# Patient Record
Sex: Male | Born: 1960
Health system: Southern US, Community
[De-identification: ages and names within clinical notes are randomized; demographics above are authoritative.]

## PROBLEM LIST (undated history)

## (undated) DIAGNOSIS — I4892 Unspecified atrial flutter: Secondary | ICD-10-CM

## (undated) HISTORY — PX: HERNIA REPAIR: SHX51

## (undated) HISTORY — PX: OTHER SURGICAL HISTORY: SHX169

## (undated) HISTORY — PX: BACK SURGERY: SHX140

## (undated) HISTORY — PX: ELECTROPHYSIOLOGIC STUDY: SHX172A

## (undated) HISTORY — DX: Unspecified atrial flutter: I48.92

---

## 1997-03-22 HISTORY — PX: OTHER SURGICAL HISTORY: SHX169

## 1998-03-18 ENCOUNTER — Encounter: Payer: Self-pay | Admitting: Neurosurgery

## 1998-03-18 ENCOUNTER — Observation Stay (HOSPITAL_COMMUNITY): Admission: RE | Admit: 1998-03-18 | Discharge: 1998-03-19 | Payer: Self-pay | Admitting: Neurosurgery

## 1999-11-06 ENCOUNTER — Encounter: Payer: Self-pay | Admitting: Gastroenterology

## 2007-02-01 ENCOUNTER — Ambulatory Visit: Payer: Self-pay | Admitting: Cardiology

## 2007-02-07 ENCOUNTER — Ambulatory Visit: Payer: Self-pay

## 2007-02-09 ENCOUNTER — Ambulatory Visit: Payer: Self-pay | Admitting: Internal Medicine

## 2007-02-13 ENCOUNTER — Ambulatory Visit: Payer: Self-pay

## 2007-02-20 ENCOUNTER — Ambulatory Visit: Payer: Self-pay

## 2007-03-03 ENCOUNTER — Ambulatory Visit: Payer: Self-pay | Admitting: Internal Medicine

## 2007-03-07 ENCOUNTER — Ambulatory Visit (HOSPITAL_COMMUNITY): Admission: RE | Admit: 2007-03-07 | Discharge: 2007-03-08 | Payer: Self-pay | Admitting: Internal Medicine

## 2007-03-07 ENCOUNTER — Ambulatory Visit: Payer: Self-pay | Admitting: Internal Medicine

## 2007-03-13 ENCOUNTER — Ambulatory Visit: Payer: Self-pay | Admitting: Cardiology

## 2007-03-28 ENCOUNTER — Ambulatory Visit: Payer: Self-pay | Admitting: Internal Medicine

## 2008-07-04 ENCOUNTER — Emergency Department (HOSPITAL_COMMUNITY): Admission: EM | Admit: 2008-07-04 | Discharge: 2008-07-04 | Payer: Self-pay | Admitting: Family Medicine

## 2008-10-10 ENCOUNTER — Emergency Department (HOSPITAL_COMMUNITY): Admission: EM | Admit: 2008-10-10 | Discharge: 2008-10-10 | Payer: Self-pay | Admitting: Family Medicine

## 2009-01-28 ENCOUNTER — Ambulatory Visit: Payer: Self-pay | Admitting: Gastroenterology

## 2009-01-28 DIAGNOSIS — K625 Hemorrhage of anus and rectum: Secondary | ICD-10-CM

## 2009-01-28 LAB — CONVERTED CEMR LAB
Basophils Absolute: 0 10*3/uL (ref 0.0–0.1)
Basophils Relative: 0.6 % (ref 0.0–3.0)
Eosinophils Absolute: 0.1 10*3/uL (ref 0.0–0.7)
Eosinophils Relative: 2.3 % (ref 0.0–5.0)
HCT: 42.7 % (ref 39.0–52.0)
Hemoglobin: 14.1 g/dL (ref 13.0–17.0)
Lymphocytes Relative: 30.5 % (ref 12.0–46.0)
Lymphs Abs: 1.8 10*3/uL (ref 0.7–4.0)
MCHC: 33.2 g/dL (ref 30.0–36.0)
MCV: 99 fL (ref 78.0–100.0)
Monocytes Absolute: 0.5 10*3/uL (ref 0.1–1.0)
Monocytes Relative: 8.9 % (ref 3.0–12.0)
Neutro Abs: 3.5 10*3/uL (ref 1.4–7.7)
Neutrophils Relative %: 57.7 % (ref 43.0–77.0)
Platelets: 200 10*3/uL (ref 150.0–400.0)
RBC: 4.31 M/uL (ref 4.22–5.81)
RDW: 11.8 % (ref 11.5–14.6)
WBC: 5.9 10*3/uL (ref 4.5–10.5)

## 2009-02-06 ENCOUNTER — Telehealth: Payer: Self-pay | Admitting: Gastroenterology

## 2009-02-07 ENCOUNTER — Ambulatory Visit: Payer: Self-pay | Admitting: Gastroenterology

## 2009-06-16 ENCOUNTER — Ambulatory Visit (HOSPITAL_COMMUNITY): Admission: RE | Admit: 2009-06-16 | Discharge: 2009-06-16 | Payer: Self-pay | Admitting: Family Medicine

## 2010-08-04 NOTE — Assessment & Plan Note (Signed)
[facility]                         ELECTROPHYSIOLOGY OFFICE NOTE   PAU, BANH                       MRN:          [id number]  DATE:03/28/2007                            DOB:          [birth year]    Mr. Schreur returns today in followup.  He is a very pleasant 50-year-  old man who has atrial flutter and underwent electrophysiology study and  catheter ablation approximately 3 weeks ago.  The patient initially  presented with palpitations and lightheadedness occurring with exercise  and noted that his heart rate was quite high.  He was subsequently found  to be in atrial flutter and was placed on Coumadin therapy.  He  underwent electrophysiologically study and catheter ablation on December  16, and at that time he was found to have typical counterclockwise  tricuspid annular reentrant atrial flutter for which he underwent  successful ablation.  He returns today for followup.  He had no specific  complaints.  He denies palpitations, chest pain or other symptoms.  He  has gone back to exercise and has felt well.   PHYSICAL EXAMINATION:  GENERAL:  He is a pleasant, well-appearing, 55-  year-old man in no distress.  VITAL SIGNS:  Blood pressure was 124/82.  The pulse was 90 and regular.  Respirations were 18.  Weight was 195 pounds.  NECK:  Revealed no jugular venous distention.  LUNGS:  Clear bilaterally to auscultation.  No wheezes, rales or rhonchi  were present.  CARDIAC:  Exam revealed regular rate and rhythm with normal S1 and S2.  EXTREMITIES:  Demonstrated no edema.   EKG demonstrates sinus rhythm with first-degree AV block.   MEDICATIONS:  Include aspirin, warfarin, and multiple vitamins.   IMPRESSION:  1. Atrial flutter.  2. Status post ablation.  3. Coumadin therapy secondary to #1.   DISCUSSION:  I have asked Mr. Guthrie to discontinue his Coumadin today.  He will continue his aspirin.  I will see him back in the office on a  p.r.n. basis.     Doylene Canning. Ladona Ridgel, MD  Electronically Signed    GWT/MedQ  DD: 03/28/2007  DT: 03/28/2007  Job #: [id number]   cc:   [name], PA, [facility]

## 2010-08-04 NOTE — Assessment & Plan Note (Signed)
**[name] De-Identified via Obfuscation** Brad [name]   Brad [name], Brad [name]                       MRN:          [id number]  DATE:02/01/2007                            DOB:          [birth year]    I was asked by Brad [name] to consult on Brad [name] with a rapid  heartbeat.   HISTORY OF PRESENT ILLNESS:  Brad [name] is a 50 year old married  African-American [name], Brad [name], Brad  [name], who comes today with the above complaint.   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 He did not have a syncopal event. He  actually did not even feel his heart racing. He does know that his heart  rate has varied off and on since then between 60 and 110 beats-per-  minute.   About two weeks ago, this happened again. Over the last week or so, it  has been pretty continual.   He went to Brad [name], where an EKG on 01/26/2007 showed  possible sinus tachycardia.   PAST MEDICAL HISTORY:  He is extremely health conscious. He is in great  physical shape. His resting pulse is usually 50 to 60. He has no history  of contrast reaction. He does not smoke. He has no medication allergies.  He is on a multivitamin daily. He does not smoke, drink, or use any  recreational products. He works out 4 days per week.   His past medical history is remarkable for discectomy in 1998 and an  inguinal hernia repair in 1983.   FAMILY HISTORY:  Negative for premature coronary disease.   MEDICATIONS:  1. Omega-3 fish oil.  2. Multivitamins.   SOCIAL HISTORY:  He is a cardiac nurse. He is married and has four  children. His wife is with him today.   REVIEW OF SYSTEMS:  Negative other than the HPI. He denies any  exertional angina, dyspnea on exertion, orthopnea, PND, or peripheral  edema.   PHYSICAL EXAMINATION:  GENERAL:  Very pleasant, younger than  stated age,  African-American [name] in no acute distress. He is alert and oriented x3.  His skin is arm and dry.  VITAL SIGNS:  Height 5 foot 10 inches, weight 188 pounds. Blood pressure  138/90, pulse 112 and irregular.  HEENT:  Normocephalic atraumatic, PERRLA, extraocular movements intact,  sclerae clear, facial symmetry is normal. Dentition is satisfactory.  NECK:  Supple. Carotid upstrokes are equally bilaterally without bruits.  There is no JVD. Thyroid is not enlarged. Trachea is midline.  LUNGS:  Clear to auscultation.  HEART:  Reveals a non-displaced PMI. He had a normal S1 and S2 without  gallop. The S2 split physiologically.  ABDOMEN:  Soft with good bowel sounds. No midline bruit. No flank  bruits.  EXTREMITIES:  No cyanosis, clubbing, or edema. Pulses are intact.  NEUROLOGIC:  Intact.  SKIN:  Unremarkable.   I reviewed his EKG from the outside and it looks like it is flutter with  a variable rate.   ASSESSMENT:  1. Atrial flutter, mildly symptomatic.  2. Excellent physical conditioning.   I just received blood work from Brad [name]. He had a  normal CBC, normal comprehensive metabolic panel, normal potassium,  normal T4.   RECOMMENDATIONS:  1. Diltiazem extended release 240 mg daily for slowing his rate and      possibly conversion.  2. Enteric coated aspirin 325 mg daily.  3. 2D echocardiogram to rule out structural heart disease.  4. EP referral to Brad [name].   We talked a long time about ablation versus medical therapy. He seems to  be leaning towards the former. I would like him to get more information  and input from Brad [name].     Brad C. Daleen Squibb, MD, [facility]  Electronically Signed    TCW/MedQ  DD: 02/01/2007  DT: 02/02/2007  Job #: [id number]   cc:   Brad [name]

## 2010-08-04 NOTE — Discharge Summary (Signed)
[name], [name] NO.:  [id number]   MEDICAL RECORD NO.:  [id number]          PATIENT TYPE:  OIB   LOCATION:  6522                         FACILITY:  [facility]   PHYSICIAN:  Maple Mirza, PA   DATE OF BIRTH:  09-01-1960   DATE OF ADMISSION:  03/07/2007  DATE OF DISCHARGE:  03/08/2007                               DISCHARGE SUMMARY   PRIMARY CAREGIVER:  Dr. Julieanne Manson.   ALLERGIES:  NO KNOWN DRUG ALLERGIES.   Examination and dictation greater than 30 minutes.   FINAL DIAGNOSIS:  1. Typical counterclockwise atrial flutter.  2. Status post electrophysiology study, radiofrequency catheter      ablation of tricuspid annulus, dependent atrial flutter status post      radiofrequency catheter ablation at the atrial flutter isthmus      converting atrial flutter to sinus rhythm with isthmus block.  3. Swimmy-headedness with atrial flutter during periods of physical      exertion.   SECONDARY DIAGNOSES:  1. Status post diskectomy, 1998.  2. Status post inguinal hernia repair 1983.   PROCEDURE:  March 07, 2007, electrophysiology study, radiofrequency  catheter ablation of typical counterclockwise atrial flutter, Dr. Lewayne Bunting.   BRIEF HISTORY:  [name] is a 50 year old male.  He was working on  his Company secretary and started feeling dreamy-headed.  He did not  have frank syncope.  He did not even feel palpitations.  About 2 weeks  ago, this happened again, and he said continual tachy palpitations since  he has presented to [facility].  Electrocardiogram  showed a sinus tachycardia.   Review of the electrocardiogram by Dr. Valera Castle demonstrates atrial  flutter with variable rate.  Ablation versus medical therapy has been  discussed with the patient, and he requests consultation with  electrophysiology for ablation procedure.   The procedure was held for December 16.  He presents electively and was  seen in Short-Stay,  seen by Dr. Lewayne Bunting.  The patient was in no  acute distress.  He was in atrial flutter on presentation.  His INR was  therapeutic at 2.6.  He will be set for electrophysiology study  radiofrequency catheter ablation December 16.   HOSPITAL COURSE:  Presented electively December 16 in atrial flutter,  with no acute distress.  The patient underwent radiofrequency catheter  ablation of a counterclockwise tricuspid annulus reentry atrial flutter,  with his restoration of sinus rhythm.  He has been in sinus rhythm,  since his procedure.  He is having no tachy palpitations or dizziness.  He has no hematoma at the right groin, and discharging on post-procedure  day #1 his medications at discharge is to stop taking Cardizem.  He  continues on his:  1. Enteric-coated aspirin 325 mg daily.  2. Multivitamin daily.  3. Omega-3 caps 3 grams daily.  4. Coumadin 6 mg daily except 3 mg on Saturdays.   He has follow-up at the [facility] [place], [place],  Monday, December 22 any time between 9:00 and 12:00 a.m. He will see Dr.  Ladona Ridgel Friday, January 6, at 3:45.  At that time, he will probably be  able to stop his Coumadin.   LAB STUDIES:  Pertinent to this admission:  Complete blood count:  White  cells 4.4, hemoglobin 14.3, hematocrit 41.3, and platelets are 220.  Serum electrolytes:  Sodium is 138, potassium 3.4, chloride 103,  carbonate 28, BUN is 14, creatinine 1.09, glucose is 100, pro-time on  December 17 is on the day of discharge, 33.1, INR 3.1.  The patient's  potassium will be replenished prior to discharge.      Maple Mirza, PA     GM/MEDQ  D:  03/08/2007  T:  03/08/2007  Job:  [id number]   cc:   Doylene Canning. Ladona Ridgel, MD  [facility] Dr. [name]

## 2010-08-04 NOTE — Op Note (Signed)
NAMEWARIS, Brad NO.:  [id number]   MEDICAL RECORD NO.:  [id number]          PATIENT TYPE:  OIB   LOCATION:  2899                         FACILITY:  [facility]   PHYSICIAN:  Doylene Canning. Ladona Ridgel, MD    DATE OF BIRTH:  February 03, 1961   DATE OF PROCEDURE:  03/07/2007  DATE OF DISCHARGE:                               OPERATIVE REPORT   PROCEDURE PERFORMED:  Electrophysiologic study and catheter ablation of  atrial flutter.   INTRODUCTION:  The patient is a very pleasant 50 year old man with a  history of palpitations and documented atrial flutter now for several  months.  He was placed on Coumadin and has been therapeutic with his  INRs for the last several weeks.  He is now referred for  electrophysiologic study and catheter ablation of his atrial flutter.   PROCEDURE:  After informed consent was obtained, the patient was taken  to the diagnostic EP lab in the fasting state.  After the usual  preparation and draping, intravenous fentanyl and midazolam was given  for sedation.  A 6-French hexapolar catheter was inserted percutaneously  into the right jugular vein and advanced to the coronary sinus under  fluoroscopic guidance.  A 7-French 20-pole halo catheter was inserted  percutaneously in the right femoral vein and advanced to the right  atrium.  A 5-French quadripolar catheter was inserted percutaneously  into the right femoral vein and advanced to the His bundle region under  fluoroscopic guidance.  After measurement of basic intervals, mapping  was carried out which demonstrated typical atrial flutter with a cycle  length of 270 milliseconds.  A 7-French quadripolar ablation catheter  was then inserted into the right femoral vein and advanced into the  right atrium.  Additional mapping was carried out, and a total 7 RF  energy applications were delivered to the atrial flutter isthmus,  resulting in termination of atrial flutter and restoration of sinus  rhythm.   Bidirectional block was created.  It should be noted that  during RF energy application, the patient had spontaneous occurring  atrial fibrillation which resolved on its own back into atrial flutter.  Following ablation, rapid ventricular pacing was carried out from the RV  apex demonstrating VA Wenckebach at 590 milliseconds.  Programmed  ventricular stimulation was carried out from the RV apex at a base drive  cycle length of 811 milliseconds with the S1-S2 interval stepwise  decreased down to 520 milliseconds where the retrograde AV node ERP was  observed.  During programmed ventricular stimulation, the atrial  activation was midline and decremental.  Next, programmed atrial  stimulation was carried out from the coronary sinus at a base drive  cycle length of 914 milliseconds with the S1-S2 interval stepwise  decreased down to 420 milliseconds where the AV node ERP was observed.  During programmed atrial stimulation, there were no AH jumps and no echo  beats and no inducible SVT.  Next, rapid atrial pacing was carried out  from the coronary sinus in the high right atrium at a base drive cycle  length of 782 milliseconds stepwise decreased down to  450 milliseconds  where AV Wenckebach was observed.  During rapid atrial pacing, the PR  interval was less than the RR interval, and there was no inducible SVT.  At this point, the catheters were removed.  Hemostasis was assured, and  the patient was returned to his room in satisfactory condition.   COMPLICATIONS:  There were no immediate complications.   RESULTS:  1. Baseline ECG:  The baseline ECG demonstrates atrial flutter with a      variable ventricular response.  2. Baseline intervals.  The atrial flutter cycle length was 270      milliseconds.  The HV interval was 37 milliseconds.  QRS duration      was 107 milliseconds.  3. Rapid atrial pacing.  Rapid atrial pacing was carried out following      ablation demonstrating a AV  Wenckebach cycle length of 590      milliseconds.  During rapid ventricular pacing, the atrial      activation was midline and decremental.  4. Programmed ventricular stimulation.  Programmed ventricular      stimulation was carried out from the RV apex at base drive cycle      length of 500 milliseconds.  The S1-S2 interval was stepwise      decreased down to 520 milliseconds where retrograde AV node ERP was      observed.  During programmed ventricular stimulation, the atrial      activation sequence was midline and decremental.  5. Programmed atrial stimulation.  Programmed atrial stimulation was      carried out from the coronary sinus in the high right atrium      following ablation at a base drive cycle length of 595      milliseconds.  The S1-S2 interval was stepwise decreased down to      420 milliseconds where AV node ERP was observed.  During programmed      atrial stimulation, there no AH jumps, no echo beats, and no      inducible SVT.  6. Rapid atrial pacing.  Rapid atrial pacing was carried out following      ablation demonstrating an AV Wenckebach cycle length of 450      milliseconds.  During rapid atrial pacing, the PR interval was less      than the RR interval, and there was no inducible SVT.  7. Arrhythmias observed.      a.     Atrial flutter.  Initiation present at the time of EP study.       Duration sustained, cycle length 270 milliseconds.  Method of       termination was with catheter ablation.      b.     Atrial fibrillation.  Initiation spontaneous during the EP       study and during ablation.  Duration was sustained.  Termination       was spontaneous.  8. AH mapping.  Mapping of atrial flutter isthmus demonstrated the      usual size and orientation.  9. RF energy application.  A total of 7 RF energy applications were      delivered to the usual atrial flutter isthmus, resulting in      termination of flutter, restoration of sinus rhythm, and creation       of bidirectional block and atrial flutter isthmus.   CONCLUSION:  This study demonstrates successful electrophysiologic and  RF catheter ablation of typical atrial flutter with a total 7 RF energy  applications delivered to the usual atrial flutter isthmus, resulting in  termination of flutter, restoration of sinus rhythm, and creation of  bidirectional block and atrial flutter isthmus.      Doylene Canning. Ladona Ridgel, MD  Electronically Signed     GWT/MEDQ  D:  03/07/2007  T:  03/07/2007  Job:  [id number]   cc:   [name], PA

## 2010-09-10 ENCOUNTER — Encounter: Payer: Self-pay | Admitting: Cardiovascular Disease

## 2010-12-25 LAB — BASIC METABOLIC PANEL
CO2: 28
Chloride: 103
Creatinine, Ser: 1.09
GFR calc Af Amer: >60
Glucose, Bld: 100 — ABNORMAL HIGH

## 2010-12-25 LAB — CBC
Hemoglobin: 14.3
MCHC: 34.6
MCV: 92.9
RBC: 4.45
RDW: 12.8

## 2010-12-25 LAB — PROTIME-INR
INR: 3.1 — ABNORMAL HIGH
Prothrombin Time: 28.6 — ABNORMAL HIGH

## 2011-06-24 ENCOUNTER — Ambulatory Visit: Payer: Self-pay | Admitting: Anesthesiology

## 2011-06-28 ENCOUNTER — Ambulatory Visit: Payer: Self-pay | Admitting: Urology

## 2012-05-06 ENCOUNTER — Other Ambulatory Visit: Payer: Self-pay

## 2013-01-25 ENCOUNTER — Other Ambulatory Visit: Payer: Self-pay

## 2014-07-14 NOTE — Op Note (Signed)
PATIENT NAME:  [name], [name] MR#:  [id number] DATE OF BIRTH:  1960/03/31  DATE OF PROCEDURE:  06/28/2011  PREOPERATIVE DIAGNOSIS: Left inguinal hernia.   POSTOPERATIVE DIAGNOSES: 1. Left inguinal hernia. 2. Left spermatic cord lipoma.  PROCEDURES PERFORMED: 1. Left inguinal herniorrhaphy. 2. Excision of lipoma.  SURGEON: Anola GurneyMichael Sandra Tellefsen, MD  ANESTHESIA: General and local with spermatic cord block  per surgeon.  ANESTHETIST: Dr. Maisie Fushomas.   INDICATIONS: See the dictated history and physical. After informed consent, the patient requests the above procedure.   OPERATIVE SUMMARY: After adequate general anesthesia had been obtained, the patient was placed into the dorsal lithotomy position and the perineum was prepped and draped in the usual fashion. A left inguinal crease incision was made and carried down sharply through the skin and through the subcutaneous fatty tissue with electrocautery. Spermatic cord was identified. The external oblique fibers were divided along their course further exposing the cord. The patient had a lipoma present, as well as a hernia. The hernia sac was dissected back to the internal ring off of the cord. Ilioinguinal nerve and cord structures were carefully preserved. The cord lipoma was excised and discarded. The surgical field was irrigated with GU irrigant. The Ethicon PHSE graft was selected and the circular portion of the graft was unfurled into the retropubic space. The oblong portion of the graft was then placed beneath the external oblique fibers. A keyhole incision was made laterally in the external portion of the graft to accommodate the spermatic cord and then the edges of the external portion of the graft that were divided were then brought around the cord and anchored to the inguinal ligament with 2-0 Surgilon. The distal edge of the oblong section of the graft was then pexed to the pubic tubercle with a 2-0 Surgilon suture. The spermatic cord was then placed  into its normal anatomic position. A spermatic cord block was performed with a solution of 0.5% Marcaine and 1% Xylocaine. The external oblique fascial fibers were then reapproximated with interrupted 2-0 Surgilon suture. Subcutaneous block was then performed with 0.5% Marcaine with epinephrine. Subcutaneous fatty tissue was then reapproximated with interrupted 3-0 plain catgut and the skin was closed with 4-0 Vicryl subcuticular closure followed by Dermabond, benzoin, and Steri-Strips. Sterile dressing was applied. Sponge, needle, and instrument counts were noted to be correct. The procedure was then terminated and the patient was transferred to the recovery room in stable condition.  ____________________________ [name], MD mrw:slb D: 06/28/2011 09:16:44 ET T: 06/28/2011 13:04:08 ET JOB#: [id number]  cc: [name], MD, <Dictator> [name] MD ELECTRONICALLY SIGNED 06/30/2011 20:29

## 2014-07-14 NOTE — H&P (Signed)
PATIENT NAME:  [name], [name] MR#:  [id number] DATE OF BIRTH:  Dec 04, 1960  DATE OF ADMISSION:  06/28/2011  CHIEF COMPLAINT: Left inguinal hernia.  HISTORY OF PRESENT ILLNESS:  Mr. [name] is a 54 year old African American male with left inguinal swelling and discomfort. He was found to have an inguinal hernia and comes in now for repair of the hernia.   ALLERGIES: No drug allergies.   CURRENT MEDICATIONS: Aspirin.   PAST SURGICAL HISTORY:  1. Right inguinal herniorrhaphy in 1981. 2. Lumbar laminectomy in 1998.   SOCIAL HISTORY: He is married, has three children. He denied tobacco or alcohol use.   FAMILY HISTORY:  Remarkable for diabetes.   PAST AND CURRENT MEDICAL CONDITIONS: The patient has a history of allergic asthma.  REVIEW OF SYSTEMS:  The patient denied chest pain, shortness of breath, diabetes, stroke, or hypertension.   PHYSICAL EXAMINATION:  GENERAL: Well-nourished African American male in no acute distress.   HEENT: Sclerae were clear. Pupils were equally round and reactive to light and accommodation. Extraocular movements were intact.   NECK: Supple. No palpable cervical adenopathy.   LUNGS: Clear to auscultation.   HEART: Regular rhythm and rate without audible murmurs.   ABDOMEN: Soft, nontender abdomen.   GENITOURINARY: He was circumcised. Testes were smooth and nontender, 25 mL in size each. He had an easily reducible left inguinal hernia.   NEUROMUSCULAR: Nonfocal.   RECTAL: 20-gram smooth, nontender prostate.   IMPRESSION: Left inguinal hernia.   PLAN: Left inguinal herniorrhaphy.     ____________________________ [name]. Evelene CroonWolff, [name] mrw:bjt D: 06/17/2011 11:59:24 ET T: 06/17/2011 13:02:40 ET JOB#: [id number]  cc: [name]. Evelene CroonWolff, [name], <Dictator> [name] [name] ELECTRONICALLY SIGNED 06/17/2011 13:33

## 2014-11-29 DIAGNOSIS — J45909 Unspecified asthma, uncomplicated: Secondary | ICD-10-CM | POA: Insufficient documentation

## 2014-11-29 DIAGNOSIS — Z87898 Personal history of other specified conditions: Secondary | ICD-10-CM | POA: Insufficient documentation

## 2014-11-29 DIAGNOSIS — J302 Other seasonal allergic rhinitis: Secondary | ICD-10-CM | POA: Insufficient documentation

## 2014-12-02 ENCOUNTER — Encounter: Payer: Self-pay | Admitting: Family Medicine

## 2014-12-02 ENCOUNTER — Ambulatory Visit (INDEPENDENT_AMBULATORY_CARE_PROVIDER_SITE_OTHER): Payer: 59 | Admitting: Family Medicine

## 2014-12-02 VITALS — BP 112/76 | HR 82 | Temp 97.8°F | Resp 16 | Ht 69.0 in | Wt 193.4 lb

## 2014-12-02 DIAGNOSIS — Z0000 Encounter for general adult medical examination without abnormal findings: Secondary | ICD-10-CM | POA: Diagnosis not present

## 2014-12-02 NOTE — Progress Notes (Signed)
Subjective:     Patient ID: [name], male   DOB: Dec 07, 1960, 54 y.o.   MRN: [id number]  HPI  Chief Complaint  Patient presents with  . Annual Exam    Patient is present in office today for his annual physical, he states that he does not have any questions or concerns at this time. Patients last reported colonoscopy was 02/07/2009 which showed internal hemorrhoids, PSA 06/27/2009 results were 0.7ng/mL. Patient last recorded Td was 05/28/1996 he states that he did get tetanus updated within the last 76yrs. Patient is due for flu vaccine today and states that he will discuss with physcian.   Will be getting the flu shot through his workplace.Continues to work out regularly with weights, machines, and aerobic activity.   Review of Systems General: Feeling well HEENT: regular dental visits and eye exams Cardiovascular: no chest pain, shortness of breath, or palpitations GI: no heartburn, no change in bowel habits or blood in the stool GU: nocturia x 1, no change in bladder habits  Psychiatric: not depressed: PHQ 2: 0 Musculoskeletal: no joint pain    Objective:   Physical Exam  Constitutional: He appears well-developed and well-nourished. No distress.  Eyes: PERRLA, EOMI Neck: no thyromegaly, tenderness or nodules, carotids are palpable without bruits ENT: TM's intact without inflammation; No tonsillar enlargement or exudate, Lungs: Clear Heart : RRR without murmur or gallop Abd: bowel sounds present, soft, non-tender, no organomegaly Rectal: Prostate firm and non-tender Extremities: no edema Skin: areas of post-inflammatory hypopigmentation on his back.    Assessment:    1. Annual physical exam - Lipid panel - Comprehensive metabolic panel - PSA    Plan:    Further f/u pending lab results

## 2014-12-02 NOTE — Patient Instructions (Signed)
We will call you with the lab results. 

## 2014-12-03 ENCOUNTER — Telehealth: Payer: Self-pay

## 2014-12-03 LAB — LIPID PANEL
CHOLESTEROL TOTAL: 193 mg/dL (ref 100–199)
Chol/HDL Ratio: 2.9 ratio units (ref 0.0–5.0)
HDL: 67 mg/dL (ref >39)
LDL CALC: 113 mg/dL — AB (ref 0–99)
Triglycerides: 64 mg/dL (ref 0–149)
VLDL Cholesterol Cal: 13 mg/dL (ref 5–40)

## 2014-12-03 LAB — COMPREHENSIVE METABOLIC PANEL
ALT: 32 IU/L (ref 0–44)
AST: 32 IU/L (ref 0–40)
Albumin/Globulin Ratio: 1.5 (ref 1.1–2.5)
Albumin: 4.6 g/dL (ref 3.5–5.5)
Alkaline Phosphatase: 58 IU/L (ref 39–117)
BUN/Creatinine Ratio: 12 (ref 9–20)
BUN: 17 mg/dL (ref 6–24)
Bilirubin Total: 1.1 mg/dL (ref 0.0–1.2)
CALCIUM: 9.8 mg/dL (ref 8.7–10.2)
CO2: 26 mmol/L (ref 18–29)
CREATININE: 1.42 mg/dL — AB (ref 0.76–1.27)
Chloride: 100 mmol/L (ref 97–108)
GFR, EST AFRICAN AMERICAN: 63 mL/min/{1.73_m2} (ref >59)
GFR, EST NON AFRICAN AMERICAN: 55 mL/min/{1.73_m2} — AB (ref >59)
Globulin, Total: 3 g/dL (ref 1.5–4.5)
Glucose: 100 mg/dL — ABNORMAL HIGH (ref 65–99)
Potassium: 4.7 mmol/L (ref 3.5–5.2)
SODIUM: 140 mmol/L (ref 134–144)
Total Protein: 7.6 g/dL (ref 6.0–8.5)

## 2014-12-03 LAB — PSA: Prostate Specific Ag, Serum: 1.1 ng/mL (ref 0.0–4.0)

## 2014-12-03 NOTE — Telephone Encounter (Signed)
-----   Message from [name], [place] sent at 12/03/2014  7:47 AM EDT ----- Cholesterol is mildly elevated. Your 10 year risk of developing cardiovascular disease is calculated to be 5 %. We usually will recommend a cholesterol lowering drug for risk > 7.5%. Kidney function has declined since 2014-would like to repeat your labs in 3 months. PSA is ok.

## 2014-12-03 NOTE — Telephone Encounter (Signed)
Patient has been advised. KW 

## 2014-12-03 NOTE — Telephone Encounter (Signed)
LMTCB

## 2015-03-06 ENCOUNTER — Encounter: Payer: Self-pay | Admitting: Family Medicine

## 2015-03-06 ENCOUNTER — Ambulatory Visit (INDEPENDENT_AMBULATORY_CARE_PROVIDER_SITE_OTHER): Payer: 59 | Admitting: Family Medicine

## 2015-03-06 VITALS — BP 110/70 | HR 60 | Temp 97.0°F | Resp 16 | Wt 187.2 lb

## 2015-03-06 DIAGNOSIS — R7989 Other specified abnormal findings of blood chemistry: Secondary | ICD-10-CM

## 2015-03-06 DIAGNOSIS — R748 Abnormal levels of other serum enzymes: Secondary | ICD-10-CM

## 2015-03-06 NOTE — Progress Notes (Signed)
Subjective:     Patient ID: [name], male   DOB: 11/24/1960, 54 y.o.   MRN: [id number]  HPI  Chief Complaint  Patient presents with  . Abnormal Lab    Patient is present in office today to review over lab results that were drawn on 12/02/14. Patient had mildly elevated cholesterol with a LDL count of 113 and his kidney function has declined since 2014.  States he remembers working out heavily before getting the previous labs. No use of nsaid's or family hx of primary kidney disease. He did not work out today prior to pending labs.   Review of Systems     Objective:   Physical Exam  Constitutional: He appears well-developed and well-nourished. No distress.       Assessment:    1. Elevated serum creatinine: GFR had also declined since 2014. - Renal function panel    Plan:    Further f/u pending lab work.

## 2015-03-06 NOTE — Patient Instructions (Signed)
We will call you with the lab results. 

## 2015-03-07 LAB — RENAL FUNCTION PANEL
ALBUMIN: 4.5 g/dL (ref 3.5–5.5)
BUN/Creatinine Ratio: 16 (ref 9–20)
BUN: 18 mg/dL (ref 6–24)
CALCIUM: 9.3 mg/dL (ref 8.7–10.2)
CHLORIDE: 99 mmol/L (ref 96–106)
CO2: 25 mmol/L (ref 18–29)
CREATININE: 1.13 mg/dL (ref 0.76–1.27)
GFR calc Af Amer: 84 mL/min/{1.73_m2} (ref >59)
GFR calc non Af Amer: 72 mL/min/{1.73_m2} (ref >59)
Glucose: 104 mg/dL — ABNORMAL HIGH (ref 65–99)
Phosphorus: 3.2 mg/dL (ref 2.5–4.5)
Potassium: 4 mmol/L (ref 3.5–5.2)
Sodium: 138 mmol/L (ref 134–144)

## 2015-03-10 ENCOUNTER — Telehealth: Payer: Self-pay

## 2015-03-10 NOTE — Telephone Encounter (Signed)
-----   Message from Anola Gurneyobert Chauvin, [place] sent at 03/07/2015  7:40 AM EST ----- Notified renal function normal.

## 2015-03-25 ENCOUNTER — Ambulatory Visit (INDEPENDENT_AMBULATORY_CARE_PROVIDER_SITE_OTHER): Payer: 59 | Admitting: Family Medicine

## 2015-03-25 ENCOUNTER — Encounter: Payer: Self-pay | Admitting: Family Medicine

## 2015-03-25 VITALS — BP 102/70 | HR 62 | Temp 97.8°F | Resp 14 | Wt 187.4 lb

## 2015-03-25 DIAGNOSIS — L0591 Pilonidal cyst without abscess: Secondary | ICD-10-CM

## 2015-03-25 MED ORDER — AMOXICILLIN-POT CLAVULANATE 875-125 MG PO TABS
1.0000 | ORAL_TABLET | Freq: Two times a day (BID) | ORAL | Status: DC
Start: 2015-03-25 — End: 2015-10-16

## 2015-03-25 NOTE — Patient Instructions (Signed)
Discussed continuing warm, Epsom salt soaks daily.

## 2015-03-25 NOTE — Progress Notes (Signed)
Subjective:     Patient ID: [name], male   DOB: 07/08/1960, 55 y.o.   MRN: [id number]  HPI  Chief Complaint  Patient presents with  . Skin Problem    Patient comes in office today with concerns of a possible knot/nodule under her skin on his right leg that has been present for the past 2 days. Patient states that area is painful to the touch and appears to move when pressed on.   States he shaved/clipped hair in this area 2-3 weeks ago and may have nicked himself. He has applied Neosporin and soaked in Epsom salts.   Review of Systems  Constitutional: Negative for fever and chills.       Objective:   Physical Exam  Constitutional: He appears well-developed and well-nourished. No distress.  Skin:  Perineal area with 2 cm. Indurated nodule. No drainage with mild tenderness. No sinus tract noted.       Assessment:    1. Infected pilonidal cyst - amoxicillin-clavulanate (AUGMENTIN) 875-125 MG tablet; Take 1 tablet by mouth 2 (two) times daily.  Dispense: 14 tablet; Refill: 0    Plan:    Continue bathtub soaks.

## 2015-03-28 ENCOUNTER — Other Ambulatory Visit: Payer: Self-pay | Admitting: Family Medicine

## 2015-03-28 ENCOUNTER — Encounter: Payer: Self-pay | Admitting: Family Medicine

## 2015-03-28 ENCOUNTER — Ambulatory Visit (INDEPENDENT_AMBULATORY_CARE_PROVIDER_SITE_OTHER): Payer: 59 | Admitting: Family Medicine

## 2015-03-28 VITALS — BP 124/80 | HR 70 | Temp 97.8°F | Resp 16 | Wt 187.4 lb

## 2015-03-28 DIAGNOSIS — L0591 Pilonidal cyst without abscess: Secondary | ICD-10-CM | POA: Diagnosis not present

## 2015-03-28 NOTE — Progress Notes (Signed)
Subjective:     Patient ID: Rande Lawmanarryl R Hedberg, male   DOB: 12/24/1960, 55 y.o.   MRN: [id number]  HPI  Chief Complaint  Patient presents with  . Cyst    Patient returns to [facility] today to follow up in regards to an infect pilonidal cyst, last office visit was 03/25/15 patient was prescribed Augmentin 875-125mg  and advised to continue bath soaks. Patient states that he continues with soaks but area he says has gotten larger and is very tender.   States he has started to notice a bloody drainage.   Review of Systems  Constitutional: Negative for fever and chills.       Objective:   Physical Exam  Constitutional: He appears well-developed and well-nourished. No distress.  Skin:  Inflamed cyst with bloody to purulent drainage. C & S obtained.  Procedure note: Cleansed with betadine and infiltrated with lidocaine with epinephrine. Incised with return of blood and pus. Dressing applied.       Assessment:    1. Infected pilonidal cyst - Wound culture - INCISION AND DRAINAGE; Future    Plan:    Continue salt water soaks and complete antibiotics pending culture report.

## 2015-03-28 NOTE — Patient Instructions (Signed)
Continue Epsom salt soaks and dressing. Complete antibiotics. We will call with culture results.

## 2015-03-31 ENCOUNTER — Ambulatory Visit: Payer: 59 | Admitting: Family Medicine

## 2015-04-01 ENCOUNTER — Telehealth: Payer: Self-pay

## 2015-04-01 LAB — WOUND CULTURE

## 2015-04-01 NOTE — Telephone Encounter (Signed)
Patient was advised he states that he completed antibiotic last night and continues with epi-son salt baths. He states pain has improved and that area has not grown any bigger.

## 2015-04-01 NOTE — Telephone Encounter (Signed)
-----   Message from Anola Gurneyobert Chauvin, [place] sent at 04/01/2015  9:47 AM EST ----- No specific organism grown. Are you doing better are I & D.

## 2015-04-01 NOTE — Telephone Encounter (Signed)
Left message to call back  

## 2015-10-16 ENCOUNTER — Ambulatory Visit (INDEPENDENT_AMBULATORY_CARE_PROVIDER_SITE_OTHER): Payer: 59 | Admitting: Family Medicine

## 2015-10-16 ENCOUNTER — Encounter: Payer: Self-pay | Admitting: Family Medicine

## 2015-10-16 VITALS — BP 122/84 | HR 56 | Temp 97.5°F | Resp 16 | Wt 184.2 lb

## 2015-10-16 DIAGNOSIS — M722 Plantar fascial fibromatosis: Secondary | ICD-10-CM | POA: Diagnosis not present

## 2015-10-16 DIAGNOSIS — K219 Gastro-esophageal reflux disease without esophagitis: Secondary | ICD-10-CM | POA: Diagnosis not present

## 2015-10-16 NOTE — Progress Notes (Signed)
Subjective:     Patient ID: [name], male   DOB: 09/19/1960, 55 y.o.   MRN: [id number]  HPI  Chief Complaint  Patient presents with  . Foot Pain    Patient comes in office today to address persistant heel pain on his right foot. Patient reports that in May he had sustained injury to foot and has had pain since, he denies taking any otc medication for relief.   States he had playing basketball prior to tripping that night with flip-flops on. Reports heel pain is worse with w.b. Particularly when he first gets up. Pain is modified when he wears gym shoes or uses ibuprofen. Also reports worsening throat clearing over the last few weeks. Reports occasional episodes of heartburn and has awakened at times choking in his sleep. Risk factors include weight lifting 4-5 x week and eating prior to bedtime. Does consume soft drinks when at work.   Review of Systems     Objective:   Physical Exam  Constitutional: He appears well-developed and well-nourished. No distress.  Pulmonary/Chest: Breath sounds normal. He has no wheezes.  Abdominal: Soft. There is no tenderness.  Musculoskeletal:  Right plantar heel with mild tenderness. DF/PF 5/5. Ankle ligaments stable.       Assessment:    1. Plantar fasciitis of right foot - Ambulatory referral to Podiatry  2. Gastroesophageal reflux disease without esophagitis    Plan:    Discussed HOB elevation on 6" blocks and not eating/drinking at least 2 hours before bed. Recommended cross training to minimize abdominal pressure.

## 2015-10-16 NOTE — Patient Instructions (Signed)
Discussed elevating head of bed 6 inches on blocks and cross train to minimize abdominal pressure. Do not eat or drink caffeine beverage at least 2 hours before bed. Let me know if this helps or your wish to be on medication.

## 2015-10-26 ENCOUNTER — Encounter: Payer: Self-pay | Admitting: Family Medicine

## 2015-10-30 ENCOUNTER — Telehealth: Payer: Self-pay

## 2015-10-30 NOTE — Telephone Encounter (Signed)
Patient is requesting a referral to ENT. Patient reports that he has been having sore throat for a few months. Patient reports that he has tried our recommendations but has not had improvement. Patient reports that he spoke to you about this at the last ov. [id number] [phone]

## 2015-11-03 ENCOUNTER — Other Ambulatory Visit: Payer: Self-pay | Admitting: Family Medicine

## 2015-11-03 DIAGNOSIS — M722 Plantar fascial fibromatosis: Secondary | ICD-10-CM | POA: Diagnosis not present

## 2015-11-03 DIAGNOSIS — M79671 Pain in right foot: Secondary | ICD-10-CM | POA: Diagnosis not present

## 2015-11-03 DIAGNOSIS — K219 Gastro-esophageal reflux disease without esophagitis: Secondary | ICD-10-CM

## 2015-11-03 NOTE — Telephone Encounter (Signed)
Referral in progress: patient notified.

## 2015-11-21 DIAGNOSIS — K219 Gastro-esophageal reflux disease without esophagitis: Secondary | ICD-10-CM | POA: Diagnosis not present

## 2015-11-21 DIAGNOSIS — J301 Allergic rhinitis due to pollen: Secondary | ICD-10-CM | POA: Diagnosis not present

## 2015-11-25 DIAGNOSIS — M79671 Pain in right foot: Secondary | ICD-10-CM | POA: Diagnosis not present

## 2015-11-25 DIAGNOSIS — M722 Plantar fascial fibromatosis: Secondary | ICD-10-CM | POA: Diagnosis not present

## 2015-12-04 ENCOUNTER — Ambulatory Visit (INDEPENDENT_AMBULATORY_CARE_PROVIDER_SITE_OTHER): Payer: 59 | Admitting: Family Medicine

## 2015-12-04 ENCOUNTER — Encounter: Payer: Self-pay | Admitting: Family Medicine

## 2015-12-04 VITALS — BP 120/72 | HR 58 | Temp 97.9°F | Resp 15 | Ht 69.0 in | Wt 184.0 lb

## 2015-12-04 DIAGNOSIS — Z0000 Encounter for general adult medical examination without abnormal findings: Secondary | ICD-10-CM | POA: Diagnosis not present

## 2015-12-04 DIAGNOSIS — K219 Gastro-esophageal reflux disease without esophagitis: Secondary | ICD-10-CM | POA: Insufficient documentation

## 2015-12-04 NOTE — Progress Notes (Signed)
Subjective:     Patient ID: [name], male   DOB: 10/30/1960, 55 y.o.   MRN: [id number]  HPI  Chief Complaint  Patient presents with  . Annual Exam    Patientr comes in office today for annual physical, he states that he has no questions or concerns today. Patients last reported Tdap was 09/24/05, patient is due for flu vaccine today but has declined stating he wil get at another time.   Currently followed by Dr. Andee PolesVaught for hoarseness and throat clearing felt due to silent nocturnal reflux. He has been avoiding late meals and has elevated the head of his bed.   Review of Systems General: Feeling well HEENT: regular dental visits with eye exam pending. Cardiovascular: no chest pain, shortness of breath, or palpitations. Hx of atrial flutter ablation. GI: no daytime heartburn, no change in bowel habits or blood in the stool GU: nocturia x 1, no change in bladder habits. Reports elevated PSA years ago with negative urology evaluation. Psychiatric: not depressed Musculoskeletal: no joint pain    Objective:   Physical Exam  Constitutional: He appears well-developed and well-nourished. No distress.  Eyes: PERRLA, EOMI Neck: no thyromegaly, tenderness or nodules, no cervical adenopathy or carotid bruits ENT: TM's intact without inflammation; No tonsillar enlargement or exudate, Lungs: Clear Heart : RRR without murmur or gallop Abd: bowel sounds present, soft, non-tender, no organomegaly Rectal: Prostate firm and non-tender Extremities: no edema Skin: back with patches of post-inflammatory hypopigmentation     Assessment:    1. Annual physical exam - Comprehensive metabolic panel - Lipid panel - PSA    Plan:    Continue antireflux measures. Further f/u pending lab work.

## 2015-12-04 NOTE — Patient Instructions (Signed)
We will call you with the lab work. Do follow up with Dr. Andee PolesVaught as scheduled. Consider resuming Zantac at bedtime.

## 2015-12-05 ENCOUNTER — Telehealth: Payer: Self-pay

## 2015-12-05 LAB — PSA: PROSTATE SPECIFIC AG, SERUM: 1 ng/mL (ref 0.0–4.0)

## 2015-12-05 LAB — COMPREHENSIVE METABOLIC PANEL
A/G RATIO: 1.6 (ref 1.2–2.2)
ALT: 28 IU/L (ref 0–44)
AST: 39 IU/L (ref 0–40)
Albumin: 4.3 g/dL (ref 3.5–5.5)
Alkaline Phosphatase: 51 IU/L (ref 39–117)
BILIRUBIN TOTAL: 0.8 mg/dL (ref 0.0–1.2)
BUN/Creatinine Ratio: 14 (ref 9–20)
BUN: 16 mg/dL (ref 6–24)
CHLORIDE: 100 mmol/L (ref 96–106)
CO2: 26 mmol/L (ref 18–29)
Calcium: 9.4 mg/dL (ref 8.7–10.2)
Creatinine, Ser: 1.12 mg/dL (ref 0.76–1.27)
GFR calc non Af Amer: 73 mL/min/{1.73_m2} (ref >59)
GFR, EST AFRICAN AMERICAN: 84 mL/min/{1.73_m2} (ref >59)
GLOBULIN, TOTAL: 2.7 g/dL (ref 1.5–4.5)
Glucose: 95 mg/dL (ref 65–99)
POTASSIUM: 4.4 mmol/L (ref 3.5–5.2)
SODIUM: 140 mmol/L (ref 134–144)
TOTAL PROTEIN: 7 g/dL (ref 6.0–8.5)

## 2015-12-05 LAB — LIPID PANEL
CHOL/HDL RATIO: 2.3 ratio (ref 0.0–5.0)
Cholesterol, Total: 185 mg/dL (ref 100–199)
HDL: 82 mg/dL (ref >39)
LDL Calculated: 92 mg/dL (ref 0–99)
Triglycerides: 54 mg/dL (ref 0–149)
VLDL Cholesterol Cal: 11 mg/dL (ref 5–40)

## 2015-12-05 NOTE — Telephone Encounter (Signed)
-----   Message from Anola Gurneyobert Chauvin, [place] sent at 12/05/2015  7:36 AM EDT ----- Labs are great. Remind him about needing a tetanus booster either at the same time he gets a flu shot or 4 weeks later.

## 2015-12-05 NOTE — Telephone Encounter (Signed)
Patient has been advised. KW 

## 2015-12-22 DIAGNOSIS — K219 Gastro-esophageal reflux disease without esophagitis: Secondary | ICD-10-CM | POA: Diagnosis not present

## 2016-01-22 ENCOUNTER — Ambulatory Visit (INDEPENDENT_AMBULATORY_CARE_PROVIDER_SITE_OTHER): Payer: 59 | Admitting: Family Medicine

## 2016-01-22 ENCOUNTER — Encounter: Payer: Self-pay | Admitting: Family Medicine

## 2016-01-22 VITALS — BP 120/88 | HR 84 | Temp 98.2°F | Resp 16 | Wt 188.0 lb

## 2016-01-22 DIAGNOSIS — S39012A Strain of muscle, fascia and tendon of lower back, initial encounter: Secondary | ICD-10-CM | POA: Diagnosis not present

## 2016-01-22 NOTE — Progress Notes (Signed)
Subjective:     Patient ID: [name], male   DOB: 09/17/1960, 55 y.o.   MRN: [id number]  HPI  Chief Complaint  Patient presents with  . Back Pain    Lower left sided back pain x 2 days. No known injuries. Pt states it's "flank pain"; denies urinary sx. Aching pain, sometimes sharp, sometimes dull. Is constant pain. Can get up to a 9/10. Took ibuprofen 800 mg on Tuesday night, with some relief. Sitting exacerbates the pain, as well as bending forward while sitting. Lying down improves the pain. No radiation.  States he continues to work out with weights 4-5 x week. Had been doing machine leg presses the same day. States he was lying on the sofa watching tv Tuesday night. When he arose he felt the back pain. No hx of kidney stones but has had lumbar diskectomy in the 1990's.   Review of Systems  Constitutional: Negative for fever.  Genitourinary: Negative for dysuria.       Objective:   Physical Exam  Constitutional: He appears well-developed and well-nourished. No distress.  Musculoskeletal:  Localizes pain to his left lower thoracic back area. No rash or tenderness on palpation. Muscle strength in lower extremities 5/5. SLR's to 90 degrees without radiation of back pain.       Assessment:    1. Strain of back, initial encounter    Plan:    Discussed cross training, schedule nsaid's, and application of cold compresses at the end of his work day.

## 2016-01-22 NOTE — Patient Instructions (Addendum)
Schedule ibuprofen 800 mg. 3 x day with food. Apply cold compresses at the end of your workday for 20 minutes. Cross train cardio while getting better.

## 2016-03-01 DIAGNOSIS — H40013 Open angle with borderline findings, low risk, bilateral: Secondary | ICD-10-CM | POA: Diagnosis not present

## 2016-03-23 DIAGNOSIS — K219 Gastro-esophageal reflux disease without esophagitis: Secondary | ICD-10-CM | POA: Diagnosis not present

## 2017-02-24 ENCOUNTER — Encounter: Payer: Self-pay | Admitting: Family Medicine

## 2017-02-24 ENCOUNTER — Ambulatory Visit (INDEPENDENT_AMBULATORY_CARE_PROVIDER_SITE_OTHER): Payer: 59 | Admitting: Family Medicine

## 2017-02-24 VITALS — BP 124/80 | HR 51 | Temp 97.9°F | Resp 16 | Wt 187.0 lb

## 2017-02-24 DIAGNOSIS — J069 Acute upper respiratory infection, unspecified: Secondary | ICD-10-CM

## 2017-02-24 MED ORDER — AMOXICILLIN-POT CLAVULANATE 875-125 MG PO TABS
1.0000 | ORAL_TABLET | Freq: Two times a day (BID) | ORAL | 0 refills | Status: DC
Start: 2017-02-24 — End: 2017-09-02

## 2017-02-24 NOTE — Patient Instructions (Signed)
Discussed use of Mucinex D for congestion and Delsym for cough. If sinuses not improving over the next two days start the antibiotic. Also consider a steroid nasal spray for sinus inflammation.

## 2017-02-24 NOTE — Progress Notes (Signed)
Subjective:     Patient ID: Rande Lawmanarryl R Stetson, male   DOB: 07/01/1960, 56 y.o.   MRN: [id number] Chief Complaint  Patient presents with  . Sinus Problem    Patient comes in office today with conerns of sinus pain and pressure for the past 8 days. Patient states that associated with sinus pain he has had a sore throat and runny nose. Patient has tried otc Tylenol, Ibuprofen, nasal decongestant and antihistamine.    HPI States sinus congestion is less but had significant sinus headache last night. Reports minimal cough.  Review of Systems     Objective:   Physical Exam  Constitutional: He appears well-developed and well-nourished. No distress.  Ears: T.M's intact without inflammation Sinuses: non-tender Throat:Posterior pharyngeal erythema without tonsillar enlargement or exudate Neck: no cervical adenopathy Lungs: clear     Assessment:    1. Viral upper respiratory tract infection: rx for Augmentin 875 q 12 hours #20.    Plan:    Switch to Mucinex D and use Delsym as needed. Start the antibiotic if sinuses not improving in the next couple of day;

## 2017-09-02 ENCOUNTER — Encounter: Payer: Self-pay | Admitting: Family Medicine

## 2017-09-02 ENCOUNTER — Ambulatory Visit (INDEPENDENT_AMBULATORY_CARE_PROVIDER_SITE_OTHER): Payer: 59 | Admitting: Family Medicine

## 2017-09-02 ENCOUNTER — Other Ambulatory Visit: Payer: Self-pay | Admitting: Family Medicine

## 2017-09-02 VITALS — BP 112/68 | HR 86 | Temp 97.8°F | Resp 16 | Ht 70.0 in | Wt 187.0 lb

## 2017-09-02 DIAGNOSIS — Z0000 Encounter for general adult medical examination without abnormal findings: Secondary | ICD-10-CM

## 2017-09-02 DIAGNOSIS — Z23 Encounter for immunization: Secondary | ICD-10-CM | POA: Diagnosis not present

## 2017-09-02 NOTE — Progress Notes (Signed)
  Subjective:     Patient ID: [name], male   DOB: 09/01/1960, 57 y.o.   MRN: [id number] Chief Complaint  Patient presents with  . Annual Exam    Pt reports that he has been feeling, sleeping well and exercising about 5 days a week. He has no complaints today. 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 Last Tetanus about 2007.  Review of Systems General: Feeling well HEENT: regular dental visits and eye exams (glasses for driving) Cardiovascular: no chest pain, shortness of breath, or palpitations GI: no heartburn, no change in bowel habits or blood in the stool GU: nocturia x 1, no change in bladder habits  Psychiatric: not depressed Musculoskeletal: no joint pain    Objective:   Physical Exam  Constitutional: He appears well-developed and well-nourished. No distress.  Eyes: PERRLA, EOMI Neck: no thyromegaly, tenderness or nodules, no cervical adenopathy or carotid bruits. ENT: TM's intact without inflammation; No tonsillar enlargement or exudate, Lungs: Clear Heart : RRR without murmur or gallop Abd: bowel sounds present, soft, non-tender, no organomegaly Rectal: Prostate firm and non-tender Extremities: no edema Skin: no atypical lesions noted on his back     Assessment:    1. Annual physical exam - Lipid panel - Comprehensive metabolic panel - PSA  2. Need for tetanus booster - Td : Tetanus/diphtheria >7yo Preservative  free    Plan:   Further f/u pending lab work. Rx for Shingrix vaccine provided.

## 2017-09-02 NOTE — Patient Instructions (Signed)
We will call you with the lab results. 

## 2017-09-03 LAB — COMPREHENSIVE METABOLIC PANEL
ALBUMIN: 4.5 g/dL (ref 3.5–5.5)
ALK PHOS: 62 IU/L (ref 39–117)
ALT: 31 IU/L (ref 0–44)
AST: 50 IU/L — ABNORMAL HIGH (ref 0–40)
Albumin/Globulin Ratio: 1.7 (ref 1.2–2.2)
BILIRUBIN TOTAL: 1 mg/dL (ref 0.0–1.2)
BUN / CREAT RATIO: 15 (ref 9–20)
BUN: 16 mg/dL (ref 6–24)
CHLORIDE: 103 mmol/L (ref 96–106)
CO2: 24 mmol/L (ref 20–29)
CREATININE: 1.09 mg/dL (ref 0.76–1.27)
Calcium: 9.6 mg/dL (ref 8.7–10.2)
GFR calc Af Amer: 85 mL/min/{1.73_m2} (ref >59)
GFR calc non Af Amer: 74 mL/min/{1.73_m2} (ref >59)
GLUCOSE: 91 mg/dL (ref 65–99)
Globulin, Total: 2.7 g/dL (ref 1.5–4.5)
Potassium: 4.3 mmol/L (ref 3.5–5.2)
Sodium: 141 mmol/L (ref 134–144)
Total Protein: 7.2 g/dL (ref 6.0–8.5)

## 2017-09-03 LAB — LIPID PANEL
CHOL/HDL RATIO: 2.6 ratio (ref 0.0–5.0)
Cholesterol, Total: 169 mg/dL (ref 100–199)
HDL: 64 mg/dL (ref >39)
LDL CALC: 98 mg/dL (ref 0–99)
Triglycerides: 36 mg/dL (ref 0–149)
VLDL CHOLESTEROL CAL: 7 mg/dL (ref 5–40)

## 2017-09-03 LAB — PSA: PROSTATE SPECIFIC AG, SERUM: 1.2 ng/mL (ref 0.0–4.0)

## 2017-09-05 ENCOUNTER — Telehealth: Payer: Self-pay

## 2017-09-05 NOTE — Telephone Encounter (Signed)
Patient advised as below.  

## 2017-09-05 NOTE — Telephone Encounter (Signed)
-----   Message from Anola Gurneyobert Chauvin, [place] sent at 09/05/2017  7:26 AM EDT ----- Labs are good

## 2017-12-02 DIAGNOSIS — H40003 Preglaucoma, unspecified, bilateral: Secondary | ICD-10-CM | POA: Diagnosis not present

## 2018-08-05 ENCOUNTER — Encounter: Payer: Self-pay | Admitting: Emergency Medicine

## 2018-08-05 ENCOUNTER — Emergency Department
Admission: EM | Admit: 2018-08-05 | Discharge: 2018-08-05 | Disposition: A | Discharge status: Home / Self Care | Payer: No Typology Code available for payment source | Attending: Emergency Medicine | Admitting: Emergency Medicine

## 2018-08-05 ENCOUNTER — Other Ambulatory Visit: Payer: Self-pay

## 2018-08-05 ENCOUNTER — Emergency Department: Payer: No Typology Code available for payment source

## 2018-08-05 DIAGNOSIS — J45909 Unspecified asthma, uncomplicated: Secondary | ICD-10-CM | POA: Insufficient documentation

## 2018-08-05 DIAGNOSIS — Z23 Encounter for immunization: Secondary | ICD-10-CM | POA: Diagnosis not present

## 2018-08-05 DIAGNOSIS — Y998 Other external cause status: Secondary | ICD-10-CM | POA: Insufficient documentation

## 2018-08-05 DIAGNOSIS — S61250A Open bite of right index finger without damage to nail, initial encounter: Secondary | ICD-10-CM | POA: Insufficient documentation

## 2018-08-05 DIAGNOSIS — W5911XA Bitten by nonvenomous snake, initial encounter: Secondary | ICD-10-CM | POA: Diagnosis not present

## 2018-08-05 DIAGNOSIS — Y9289 Other specified places as the place of occurrence of the external cause: Secondary | ICD-10-CM | POA: Diagnosis not present

## 2018-08-05 DIAGNOSIS — Y9389 Activity, other specified: Secondary | ICD-10-CM | POA: Insufficient documentation

## 2018-08-05 DIAGNOSIS — M79641 Pain in right hand: Secondary | ICD-10-CM | POA: Insufficient documentation

## 2018-08-05 LAB — BASIC METABOLIC PANEL
Anion gap: 9 (ref 5–15)
BUN: 18 mg/dL (ref 6–20)
CO2: 25 mmol/L (ref 22–32)
Calcium: 9 mg/dL (ref 8.9–10.3)
Chloride: 104 mmol/L (ref 98–111)
Creatinine, Ser: 1.06 mg/dL (ref 0.61–1.24)
GFR calc Af Amer: >60 mL/min (ref >60)
GFR calc non Af Amer: >60 mL/min (ref >60)
Glucose, Bld: 111 mg/dL — ABNORMAL HIGH (ref 70–99)
Potassium: 3.5 mmol/L (ref 3.5–5.1)
Sodium: 138 mmol/L (ref 135–145)

## 2018-08-05 LAB — CBC WITH DIFFERENTIAL/PLATELET
Abs Immature Granulocytes: 0.01 K/uL (ref 0.00–0.07)
Abs Immature Granulocytes: 0.02 K/uL (ref 0.00–0.07)
Basophils Absolute: 0 K/uL (ref 0.0–0.1)
Basophils Absolute: 0 K/uL (ref 0.0–0.1)
Basophils Relative: 1 %
Basophils Relative: 1 %
Eosinophils Absolute: 0.2 K/uL (ref 0.0–0.5)
Eosinophils Absolute: 0.1 K/uL (ref 0.0–0.5)
Eosinophils Relative: 4 %
Eosinophils Relative: 1 %
HCT: 45.8 % (ref 39.0–52.0)
HCT: 45.2 % (ref 39.0–52.0)
Hemoglobin: 15.4 g/dL (ref 13.0–17.0)
Hemoglobin: 15.2 g/dL (ref 13.0–17.0)
Immature Granulocytes: 0 %
Immature Granulocytes: 0 %
Lymphocytes Relative: 47 %
Lymphocytes Relative: 21 %
Lymphs Abs: 2.2 K/uL (ref 0.7–4.0)
Lymphs Abs: 1.5 K/uL (ref 0.7–4.0)
MCH: 31.5 pg (ref 26.0–34.0)
MCH: 31.6 pg (ref 26.0–34.0)
MCHC: 33.6 g/dL (ref 30.0–36.0)
MCHC: 33.6 g/dL (ref 30.0–36.0)
MCV: 93.7 fL (ref 80.0–100.0)
MCV: 94 fL (ref 80.0–100.0)
Monocytes Absolute: 0.5 K/uL (ref 0.1–1.0)
Monocytes Absolute: 0.6 K/uL (ref 0.1–1.0)
Monocytes Relative: 11 %
Monocytes Relative: 8 %
Neutro Abs: 1.7 K/uL (ref 1.7–7.7)
Neutro Abs: 5 K/uL (ref 1.7–7.7)
Neutrophils Relative %: 37 %
Neutrophils Relative %: 69 %
Platelets: 213 K/uL (ref 150–400)
Platelets: 220 K/uL (ref 150–400)
RBC: 4.89 MIL/uL (ref 4.22–5.81)
RBC: 4.81 MIL/uL (ref 4.22–5.81)
RDW: 11.9 % (ref 11.5–15.5)
RDW: 11.8 % (ref 11.5–15.5)
WBC: 4.7 K/uL (ref 4.0–10.5)
WBC: 7.2 K/uL (ref 4.0–10.5)
nRBC: 0 % (ref 0.0–0.2)
nRBC: 0 % (ref 0.0–0.2)

## 2018-08-05 LAB — PROTIME-INR
INR: 1.1 (ref 0.8–1.2)
INR: 1.1 (ref 0.8–1.2)
Prothrombin Time: 14 seconds (ref 11.4–15.2)
Prothrombin Time: 14.1 seconds (ref 11.4–15.2)

## 2018-08-05 LAB — APTT: aPTT: 37 seconds — ABNORMAL HIGH (ref 24–36)

## 2018-08-05 LAB — FIBRINOGEN
Fibrinogen: 263 mg/dL (ref 210–475)
Fibrinogen: 267 mg/dL (ref 210–475)

## 2018-08-05 MED ORDER — TETANUS-DIPHTH-ACELL PERTUSSIS 5-2.5-18.5 LF-MCG/0.5 IM SUSP
0.5000 mL | Freq: Once | INTRAMUSCULAR | Status: AC
  Administered 2018-08-05: 0.5 mL via INTRAMUSCULAR
  Filled 2018-08-05: qty 0.5

## 2018-08-05 MED ORDER — ONDANSETRON HCL 4 MG/2ML IJ SOLN
4.0000 mg | Freq: Once | INTRAMUSCULAR | Status: AC
  Administered 2018-08-05: 4 mg via INTRAVENOUS
  Filled 2018-08-05: qty 2

## 2018-08-05 MED ORDER — MORPHINE SULFATE (PF) 4 MG/ML IV SOLN
4.0000 mg | Freq: Once | INTRAVENOUS | Status: AC
  Administered 2018-08-05: 4 mg via INTRAVENOUS
  Filled 2018-08-05: qty 1

## 2018-08-05 MED ORDER — MORPHINE SULFATE (PF) 4 MG/ML IV SOLN
4.0000 mg | INTRAVENOUS | Status: DC | PRN
  Administered 2018-08-05 (×2): 4 mg via INTRAVENOUS
  Filled 2018-08-05 (×2): qty 1

## 2018-08-05 MED ORDER — ACETAMINOPHEN 500 MG PO TABS
1000.0000 mg | ORAL_TABLET | Freq: Once | ORAL | Status: DC
  Filled 2018-08-05: qty 2

## 2018-08-05 MED ORDER — OXYCODONE HCL 5 MG PO TABS
5.0000 mg | ORAL_TABLET | Freq: Once | ORAL | Status: AC
  Administered 2018-08-05: 5 mg via ORAL
  Filled 2018-08-05: qty 1

## 2018-08-05 MED ORDER — OXYCODONE-ACETAMINOPHEN 5-325 MG PO TABS: 1.0000 | ORAL_TABLET | ORAL | 0 refills | Status: DC | PRN

## 2018-08-05 NOTE — ED Notes (Signed)
Arm elevated slightly, not above level of heart per dr Roxan Hockey.

## 2018-08-05 NOTE — ED Provider Notes (Addendum)
_________________________ 7:19 PM on 08/05/2018 -----------------------------------------    Patient continued to be monitored in the emergency room with minimal progression of swelling.  Swelling is actually significantly improved in the hand, patient is now able to make a fist.  Remains with no systemic signs of envenomation. Repeat labs showing no evidence of coagulopathy. Dr. Roxan Hockey had a long discussion with patient about risks and benefits of Crofab. Per Dr. Roxan Hockey and patient's plan, no crofab to be given unless exam worsens, or patient develops systemic signs or signs of coagulopathy. Exam is actually significantly improved.  I again talked to patient about risks and benefits of CroFab.  At this time patient wishes not to receive the antivenom.  He received a tetanus shot.  Will discharge home with a prescription for Percocet.  Discussed standard return precautions and wound care with patient.   Don Perking, [place], MD 08/05/18 1919    Nita Sickle, MD 08/05/18 [place] [place]

## 2018-08-05 NOTE — ED Triage Notes (Signed)
Copperhead bite R hand about 1130 today. Small amount of swelling noted.

## 2018-08-05 NOTE — ED Notes (Addendum)
Right wrist 7.5 in No changes to forearm/upper arm

## 2018-08-05 NOTE — ED Notes (Signed)
pt reports he took his own tylenol in room. requested pt not take any more of his own medication so that we know everything being taken.

## 2018-08-05 NOTE — ED Notes (Signed)
Arm elevated further per dr Roxan Hockey.

## 2018-08-05 NOTE — ED Notes (Signed)
Swelling increasing. Dr Roxan Hockey notified.

## 2018-08-05 NOTE — ED Notes (Signed)
Wrist 7.25 in Forearm 11.75 in Upper arm 13.75 in

## 2018-08-05 NOTE — ED Notes (Addendum)
Circumference measurements of right arm. 7.75" wrist, 8.5" forearm, 13.5" biceps

## 2018-08-05 NOTE — ED Provider Notes (Addendum)
[facility] Emergency Department Provider Note    First MD Initiated Contact with Patient 08/05/18 1201     (approximate)  I have reviewed the triage vital signs and the nursing notes.   HISTORY  Chief Complaint Snake Bite    HPI Robertson R [name] is a 58 y.o. male with below listed past medical history presents the ER after snakebite occurred at home.  Is complaining of mild to moderate pain and swelling to the right second digit.  He is right-hand dominant.  Denies any nausea vomiting.  No shortness of breath.  No headache weakness dizziness chills or paresthesias.   Past Medical History:  Diagnosis Date  . Asthma   . Atrial flutter (HCC)    Family History  Problem Relation Age of Onset  . Diabetes Mother   . Hyperlipidemia Mother   . Emphysema Father   . Glaucoma Father        open-angle  . Asthma Sister    Past Surgical History:  Procedure Laterality Date  . ELECTROPHYSIOLOGIC STUDY     atrial flutter  . hernia (other)  1980s  . lumbar back surgery  1999   Patient Active Problem List   Diagnosis Date Noted  . Allergic rhinitis, seasonal 11/29/2014      Prior to Admission medications   Medication Sig Start Date End Date Taking? Authorizing Provider  aspirin (ASPIR-LOW) 81 MG EC tablet Take 81 mg by mouth daily.      [provider]  Cholecalciferol (VITAMIN D) 2000 units CAPS Take 4,000 Units by mouth daily.    [provider]  KRILL OIL PO Take by mouth.    [provider]  Multiple Vitamins-Minerals (DAILY MULTI) TABS Take by mouth daily.      [provider]  Turmeric 500 MG CAPS Take 1,000 mg by mouth daily.    [provider]    Allergies Patient has no known allergies.    Social History Social History   Tobacco Use  . Smoking status: Never Smoker  . Smokeless tobacco: Never Used  Substance Use Topics  . Alcohol use: No  . Drug use: No    Review of Systems Patient  denies headaches, rhinorrhea, blurry vision, numbness, shortness of breath, chest pain, edema, cough, abdominal pain, nausea, vomiting, diarrhea, dysuria, fevers, rashes or hallucinations unless otherwise stated above in HPI. ____________________________________________   PHYSICAL EXAM:  VITAL SIGNS: Vitals:   08/05/18 1500 08/05/18 1530  BP: (!) 136/97 (!) 148/85  Pulse: (!) 48 (!) 51  Resp: 16   Temp:    SpO2: 96% 98%    Constitutional: Alert and oriented.  Eyes: Conjunctivae are normal.  Head: Atraumatic. Nose: No congestion/rhinnorhea. Mouth/Throat: Mucous membranes are moist.   Neck: No stridor. Painless ROM.  Cardiovascular: Normal rate, regular rhythm. Grossly normal heart sounds.  Good peripheral circulation. Respiratory: Normal respiratory effort.  No retractions. Lungs CTAB. Gastrointestinal: Soft and nontender. No distention. No abdominal bruits. No CVA tenderness. Genitourinary: deferred Musculoskeletal: Puncture mark just distal to the PIP joint of the second right digit with surrounding swelling over the second MCP.  No ecchymosis or crepitus.  Sensation is intact distally.  Range of motion somewhat limited secondary to swelling and discomfort.  No lower extremity tenderness nor edema.  No joint effusions. Neurologic:  Normal speech and language. No gross focal neurologic deficits are appreciated. No facial droop Skin:  Skin is warm, dry and intact. No rash noted. Psychiatric: Mood and affect are  normal. Speech and behavior are normal.  ____________________________________________   LABS (all labs ordered are listed, but only abnormal results are displayed)  Results for orders placed or performed during the hospital encounter of 08/05/18 (from the past 24 hour(s))  Basic metabolic panel     Status: Abnormal   Collection Time: 08/05/18 12:06 PM  Result Value Ref Range   Sodium 138 135 - 145 mmol/L   Potassium 3.5 3.5 - 5.1 mmol/L   Chloride 104 98 - 111 mmol/L    CO2 25 22 - 32 mmol/L   Glucose, Bld 111 (H) 70 - 99 mg/dL   BUN 18 6 - 20 mg/dL   Creatinine, Ser 8.29 0.61 - 1.24 mg/dL   Calcium 9.0 8.9 - 56.2 mg/dL   GFR calc non Af Amer >60 >60 mL/min   GFR calc Af Amer >60 >60 mL/min   Anion gap 9 5 - 15  CBC WITH DIFFERENTIAL     Status: None   Collection Time: 08/05/18 12:06 PM  Result Value Ref Range   WBC 4.7 4.0 - 10.5 K/uL   RBC 4.89 4.22 - 5.81 MIL/uL   Hemoglobin 15.4 13.0 - 17.0 g/dL   HCT 13.0 86.5 - 78.4 %   MCV 93.7 80.0 - 100.0 fL   MCH 31.5 26.0 - 34.0 pg   MCHC 33.6 30.0 - 36.0 g/dL   RDW 69.6 29.5 - 28.4 %   Platelets 213 150 - 400 K/uL   nRBC 0.0 0.0 - 0.2 %   Neutrophils Relative % 37 %   Neutro Abs 1.7 1.7 - 7.7 K/uL   Lymphocytes Relative 47 %   Lymphs Abs 2.2 0.7 - 4.0 K/uL   Monocytes Relative 11 %   Monocytes Absolute 0.5 0.1 - 1.0 K/uL   Eosinophils Relative 4 %   Eosinophils Absolute 0.2 0.0 - 0.5 K/uL   Basophils Relative 1 %   Basophils Absolute 0.0 0.0 - 0.1 K/uL   Immature Granulocytes 0 %   Abs Immature Granulocytes 0.01 0.00 - 0.07 K/uL  Protime-INR     Status: None   Collection Time: 08/05/18 12:06 PM  Result Value Ref Range   Prothrombin Time 14.0 11.4 - 15.2 seconds   INR 1.1 0.8 - 1.2  Fibrinogen     Status: None   Collection Time: 08/05/18 12:06 PM  Result Value Ref Range   Fibrinogen 263 210 - 475 mg/dL  APTT     Status: Abnormal   Collection Time: 08/05/18  3:00 PM  Result Value Ref Range   aPTT 37 (H) 24 - 36 seconds   ____________________________________________ ____________________________________________  RADIOLOGY  I personally reviewed all radiographic images ordered to evaluate for the above acute complaints and reviewed radiology reports and findings.  These findings were personally discussed with the patient.  Please see medical record for radiology report.  ____________________________________________   PROCEDURES  Procedure(s) performed:  Procedures    Critical  Care performed: no ____________________________________________   INITIAL IMPRESSION / ASSESSMENT AND PLAN / ED COURSE  Pertinent labs & imaging results that were available during my care of the patient were reviewed by me and considered in my medical decision making (see chart for details).   DDX: snake bite, fracture, contusion, cellulitis  Samba R Sliwa is a 58 y.o. who presents to the ED with snakebite to right hand as described above.  Patient hemodynamically stable.  No systemic symptoms initially.  Does have localized pain and swelling.  Will observe.  Clinical Course  as of Aug 05 1611  Sat Aug 05, 2018  1301 Patient having pain at site.  Is having some swelling but based on blood work and exam snakebite severity score of only 1 at this time.  We will continue observation here in the ER.   [PR]  1351 She does have slowly progressing swelling now just at the wrist.  No systemic effects at this time.  Only localized swelling.  We will continue to treat pain.  Currently has score of 2 based on CroFab criteria.  Does not meet Theodosia Quayhierry for CroFab injection at this time.  We will continue observe.   [PR]  1441 Patient reassessed.  Pain is stabilizing.  Reassessment does not seem to be having any worsening or rapidly progressing swelling.   [PR]  1534 Went to check on patient again was found with his hand down by his head but not elevated.  Will re-elevate patient and reassess.  Again does not seem to be rapidly progressing and that there is noted swelling he is able to have increased movement of his hands and fingers.  I discussed this with poison control who agrees with holding off on CroFab at this point given the risks of this medication in the setting of the patient not showing any systemic findings.   [PR]  1605 Patient reassessed prior to the end of my shift.  Swelling is slightly reduced.  He is moving his fingers better.  States he still still having pain but it does appear softer  clinically.  Still does not meet any indication for CroFab at this time.  Will be signed out to oncoming physician pending reassessment.   [PR]    Clinical Course User Index [PR] Willy Eddyobinson, Donabelle Molden, MD    The patient was evaluated in Emergency Department today for the symptoms described in the history of present illness. He/she was evaluated in the context of the global COVID-19 pandemic, which necessitated consideration that the patient might be at risk for infection with the SARS-CoV-2 virus that causes COVID-19. Institutional protocols and algorithms that pertain to the evaluation of patients at risk for COVID-19 are in a state of rapid change based on information released by regulatory bodies including the CDC and federal and state organizations. These policies and algorithms were followed during the patient's care in the ED.  As part of my medical decision making, I reviewed the following data within the electronic MEDICAL RECORD NUMBER Nursing notes reviewed and incorporated, Labs reviewed, notes from prior ED visits and [place] Controlled Substance Database   ____________________________________________   FINAL CLINICAL IMPRESSION(S) / ED DIAGNOSES  Final diagnoses:  Snake bite, initial encounter      NEW MEDICATIONS STARTED DURING THIS VISIT:  New Prescriptions   No medications on file     Note:  This document was prepared using Dragon voice recognition software and may include unintentional dictation errors.  Crofab administration criteria: Vaccination: Update tetanus vaccine information. If last vaccination >10 years or unknown, give tetanus booster, Tdap. Criteria for administration of CroFab: CroFab is indicated to treat any local or systemic signs of envenomation such as pain, swelling, fasciculation, paresthesias, or measured hematologic abnormalities (thrombocytopenia, hypofibrinoginemia, or prothrombin time elevations).   Criteria  Pulmonary Symptoms: 0 - No symptoms/signs  1 - Dyspnea, minimal chest tightness, mild or vague discomfort, or respirations of 20-25 breaths/minute 2 - Moderate respiratory distress (tachypnea, 26-40 breaths/minute; accessory muscle use) 3 - Cyanosis, air hunger, extreme tachypnea, or respiratory insufficiency/failure (3)   Cardiovascular Symptoms: 0 - No symptoms/signs 1 - Tachycardia (100-125 BPM), palpitations, generalized weakness, benign dysrhythmia, or hypotension 2 - Tachycardia (126-175 BPM) or hypotension, with SBP > 100 mmHg 3 - Extreme Tachycarida (>175 BPM), hypotension with SBP < 100 mmHg, malignant dysrhythmia, or cardiac arrest   Local Wound: 0 - No symptoms/signs 1 - Pain, swelling, or ecchymosis within 5-7.5cm of bite site 2 - Pain, swelling, or ecchymosis involving less than half the extremity (7.5-50cm from bite site) 3 - Pain, swelling, or ecchymosis involving half to all of the extremity (50-100cm from bite site) 4 - Pain, swelling, or ecchymosis extending beyond affected extremity (more than 100cm from the bite site) Gastrointestinal System: 0 - No symptoms/signs 1 - Pain, tenesmus or nausea 2 - Vomiting or diarrhea 3 - Repeated vomiting, diarrhea, hematemesis, or hematochezia   Hematologic Symptoms: 0 - No symptoms/signs 1 - Coagulation parameters slightly abnormal: PT <20 sec, PTT <50 sec, PLT 100-150K/ml, or fibrinogen 100-150 mcg/ml 2 - Coagulation parameters abnormal: PT <20-25 sec, PTT <50-75 sec, PLT 50-100K/ml, or fibrinogen 50-100 mcg/ml 3 - Coagulation parameters abnormal: PT <50-100 sec, PTT <75-100 sec, PLT 20-50K/ml, or fibrinogen 50 mcg/ml 4 - Coagulation parameters markedly abnormal with serious bleeding or the threat of spontaneous bleeding: unmeasurable PT or PTT, PLT <20K/ml, or undetectable fibrinogen; severe abnomalities  of other laboratory values also fall into this category   Central Nervous System: 0 - No symptoms/signs 1 - Minimal apprehension, headache, weakness, dizziness, chills, or paresthesia 2 - Moderate apprehension, headache, weakness, dizziness, chills, paresthesia, confusion, or fasciculation in area of bite site 3 - Severe confusion, lethargy, seizures, coma, psychosis, or generalized fasciculation   Score:          Severity of Envenomation  Snakebite Severity Score 0-3 (Minimal) 4-7 (Moderate) 8-20 (Severe)  CroFab Indicated? No Yes Yes; may require multiple doses      Willy Eddy, MD 08/05/18 1517    Willy Eddy, MD 08/05/18 [phone]

## 2018-08-06 ENCOUNTER — Other Ambulatory Visit: Payer: Self-pay

## 2018-08-06 ENCOUNTER — Observation Stay (HOSPITAL_COMMUNITY)
Admission: EM | Admit: 2018-08-06 | Discharge: 2018-08-07 | Disposition: A | Discharge status: Home / Self Care | Payer: No Typology Code available for payment source | Attending: Family Medicine | Admitting: Family Medicine

## 2018-08-06 ENCOUNTER — Encounter (HOSPITAL_COMMUNITY): Payer: Self-pay

## 2018-08-06 ENCOUNTER — Encounter: Payer: Self-pay | Admitting: Emergency Medicine

## 2018-08-06 ENCOUNTER — Emergency Department
Admission: EM | Admit: 2018-08-06 | Discharge: 2018-08-06 | Disposition: A | Discharge status: Home / Self Care | Payer: No Typology Code available for payment source | Source: Home / Self Care | Attending: Student in an Organized Health Care Education/Training Program | Admitting: Student in an Organized Health Care Education/Training Program

## 2018-08-06 ENCOUNTER — Emergency Department
Admission: EM | Admit: 2018-08-06 | Discharge: 2018-08-06 | Disposition: A | Discharge status: Home / Self Care | Payer: No Typology Code available for payment source | Attending: Emergency Medicine | Admitting: Emergency Medicine

## 2018-08-06 DIAGNOSIS — J45909 Unspecified asthma, uncomplicated: Secondary | ICD-10-CM

## 2018-08-06 DIAGNOSIS — Z23 Encounter for immunization: Secondary | ICD-10-CM | POA: Diagnosis not present

## 2018-08-06 DIAGNOSIS — W5911XA Bitten by nonvenomous snake, initial encounter: Secondary | ICD-10-CM

## 2018-08-06 DIAGNOSIS — T63091A Toxic effect of venom of other snake, accidental (unintentional), initial encounter: Secondary | ICD-10-CM | POA: Insufficient documentation

## 2018-08-06 DIAGNOSIS — S61230A Puncture wound without foreign body of right index finger without damage to nail, initial encounter: Principal | ICD-10-CM | POA: Insufficient documentation

## 2018-08-06 DIAGNOSIS — I4892 Unspecified atrial flutter: Secondary | ICD-10-CM | POA: Insufficient documentation

## 2018-08-06 DIAGNOSIS — Z5329 Procedure and treatment not carried out because of patient's decision for other reasons: Secondary | ICD-10-CM | POA: Insufficient documentation

## 2018-08-06 DIAGNOSIS — Z7982 Long term (current) use of aspirin: Secondary | ICD-10-CM | POA: Insufficient documentation

## 2018-08-06 DIAGNOSIS — Z79899 Other long term (current) drug therapy: Secondary | ICD-10-CM | POA: Insufficient documentation

## 2018-08-06 DIAGNOSIS — Y92007 Garden or yard of unspecified non-institutional (private) residence as the place of occurrence of the external cause: Secondary | ICD-10-CM | POA: Insufficient documentation

## 2018-08-06 DIAGNOSIS — Z1159 Encounter for screening for other viral diseases: Secondary | ICD-10-CM | POA: Diagnosis not present

## 2018-08-06 DIAGNOSIS — S61451D Open bite of right hand, subsequent encounter: Secondary | ICD-10-CM | POA: Insufficient documentation

## 2018-08-06 DIAGNOSIS — R2231 Localized swelling, mass and lump, right upper limb: Secondary | ICD-10-CM | POA: Insufficient documentation

## 2018-08-06 DIAGNOSIS — W5911XD Bitten by nonvenomous snake, subsequent encounter: Secondary | ICD-10-CM | POA: Insufficient documentation

## 2018-08-06 DIAGNOSIS — R55 Syncope and collapse: Secondary | ICD-10-CM | POA: Insufficient documentation

## 2018-08-06 DIAGNOSIS — Z9889 Other specified postprocedural states: Secondary | ICD-10-CM | POA: Insufficient documentation

## 2018-08-06 LAB — CBC WITH DIFFERENTIAL/PLATELET
Abs Immature Granulocytes: 0.02 10*3/uL (ref 0.00–0.07)
Abs Immature Granulocytes: 0.02 10*3/uL (ref 0.00–0.07)
Abs Immature Granulocytes: 0.03 10*3/uL (ref 0.00–0.07)
Basophils Absolute: 0 10*3/uL (ref 0.0–0.1)
Basophils Absolute: 0 10*3/uL (ref 0.0–0.1)
Basophils Absolute: 0 10*3/uL (ref 0.0–0.1)
Basophils Relative: 0 %
Basophils Relative: 1 %
Basophils Relative: 1 %
Eosinophils Absolute: 0.1 10*3/uL (ref 0.0–0.5)
Eosinophils Absolute: 0.1 10*3/uL (ref 0.0–0.5)
Eosinophils Absolute: 0.1 10*3/uL (ref 0.0–0.5)
Eosinophils Relative: 1 %
Eosinophils Relative: 1 %
Eosinophils Relative: 2 %
HCT: 42.1 % (ref 39.0–52.0)
HCT: 43.6 % (ref 39.0–52.0)
HCT: 45.8 % (ref 39.0–52.0)
Hemoglobin: 14 g/dL (ref 13.0–17.0)
Hemoglobin: 14.1 g/dL (ref 13.0–17.0)
Hemoglobin: 15.6 g/dL (ref 13.0–17.0)
Immature Granulocytes: 0 %
Immature Granulocytes: 0 %
Immature Granulocytes: 1 %
Lymphocytes Relative: 13 %
Lymphocytes Relative: 21 %
Lymphocytes Relative: 27 %
Lymphs Abs: 0.9 10*3/uL (ref 0.7–4.0)
Lymphs Abs: 1.3 10*3/uL (ref 0.7–4.0)
Lymphs Abs: 1.9 10*3/uL (ref 0.7–4.0)
MCH: 31.3 pg (ref 26.0–34.0)
MCH: 31.7 pg (ref 26.0–34.0)
MCH: 31.8 pg (ref 26.0–34.0)
MCHC: 32.3 g/dL (ref 30.0–36.0)
MCHC: 33.3 g/dL (ref 30.0–36.0)
MCHC: 34.1 g/dL (ref 30.0–36.0)
MCV: 93.5 fL (ref 80.0–100.0)
MCV: 95.2 fL (ref 80.0–100.0)
MCV: 96.9 fL (ref 80.0–100.0)
Monocytes Absolute: 0.5 10*3/uL (ref 0.1–1.0)
Monocytes Absolute: 0.5 10*3/uL (ref 0.1–1.0)
Monocytes Absolute: 0.5 10*3/uL (ref 0.1–1.0)
Monocytes Relative: 7 %
Monocytes Relative: 7 %
Monocytes Relative: 8 %
Neutro Abs: 4.4 10*3/uL (ref 1.7–7.7)
Neutro Abs: 4.5 10*3/uL (ref 1.7–7.7)
Neutro Abs: 5.2 10*3/uL (ref 1.7–7.7)
Neutrophils Relative %: 63 %
Neutrophils Relative %: 69 %
Neutrophils Relative %: 78 %
Platelets: 193 10*3/uL (ref 150–400)
Platelets: 208 10*3/uL (ref 150–400)
Platelets: 209 10*3/uL (ref 150–400)
RBC: 4.42 MIL/uL (ref 4.22–5.81)
RBC: 4.5 MIL/uL (ref 4.22–5.81)
RBC: 4.9 MIL/uL (ref 4.22–5.81)
RDW: 11.9 % (ref 11.5–15.5)
RDW: 12 % (ref 11.5–15.5)
RDW: 12 % (ref 11.5–15.5)
WBC: 6.3 10*3/uL (ref 4.0–10.5)
WBC: 6.7 10*3/uL (ref 4.0–10.5)
WBC: 7.1 10*3/uL (ref 4.0–10.5)
nRBC: 0 % (ref 0.0–0.2)
nRBC: 0 % (ref 0.0–0.2)
nRBC: 0 % (ref 0.0–0.2)

## 2018-08-06 LAB — COMPREHENSIVE METABOLIC PANEL
ALT: 32 U/L (ref 0–44)
ALT: 29 U/L (ref 0–44)
AST: 37 U/L (ref 15–41)
AST: 31 U/L (ref 15–41)
Albumin: 3.6 g/dL (ref 3.5–5.0)
Albumin: 3.4 g/dL — ABNORMAL LOW (ref 3.5–5.0)
Alkaline Phosphatase: 57 U/L (ref 38–126)
Alkaline Phosphatase: 48 U/L (ref 38–126)
Anion gap: 8 (ref 5–15)
Anion gap: 10 (ref 5–15)
BUN: 16 mg/dL (ref 6–20)
BUN: 14 mg/dL (ref 6–20)
CO2: 26 mmol/L (ref 22–32)
CO2: 21 mmol/L — ABNORMAL LOW (ref 22–32)
Calcium: 9 mg/dL (ref 8.9–10.3)
Calcium: 8.6 mg/dL — ABNORMAL LOW (ref 8.9–10.3)
Chloride: 105 mmol/L (ref 98–111)
Chloride: 105 mmol/L (ref 98–111)
Creatinine, Ser: 1.22 mg/dL (ref 0.61–1.24)
Creatinine, Ser: 1.13 mg/dL (ref 0.61–1.24)
GFR calc Af Amer: >60 mL/min (ref >60)
GFR calc Af Amer: >60 mL/min (ref >60)
GFR calc non Af Amer: >60 mL/min (ref >60)
GFR calc non Af Amer: >60 mL/min (ref >60)
Glucose, Bld: 88 mg/dL (ref 70–99)
Glucose, Bld: 116 mg/dL — ABNORMAL HIGH (ref 70–99)
Potassium: 4.3 mmol/L (ref 3.5–5.1)
Potassium: 4 mmol/L (ref 3.5–5.1)
Sodium: 139 mmol/L (ref 135–145)
Sodium: 136 mmol/L (ref 135–145)
Total Bilirubin: 0.8 mg/dL (ref 0.3–1.2)
Total Bilirubin: 0.6 mg/dL (ref 0.3–1.2)
Total Protein: 6 g/dL — ABNORMAL LOW (ref 6.5–8.1)
Total Protein: 6.2 g/dL — ABNORMAL LOW (ref 6.5–8.1)

## 2018-08-06 LAB — BASIC METABOLIC PANEL
Anion gap: 8 (ref 5–15)
BUN: 14 mg/dL (ref 6–20)
CO2: 26 mmol/L (ref 22–32)
Calcium: 8.8 mg/dL — ABNORMAL LOW (ref 8.9–10.3)
Chloride: 106 mmol/L (ref 98–111)
Creatinine, Ser: 0.87 mg/dL (ref 0.61–1.24)
GFR calc Af Amer: >60 mL/min (ref >60)
GFR calc non Af Amer: >60 mL/min (ref >60)
Glucose, Bld: 145 mg/dL — ABNORMAL HIGH (ref 70–99)
Potassium: 3.4 mmol/L — ABNORMAL LOW (ref 3.5–5.1)
Sodium: 140 mmol/L (ref 135–145)

## 2018-08-06 LAB — APTT
aPTT: 31 seconds (ref 24–36)
aPTT: 34 seconds (ref 24–36)

## 2018-08-06 LAB — SARS CORONAVIRUS 2 BY RT PCR (HOSPITAL ORDER, PERFORMED IN ~~LOC~~ HOSPITAL LAB): SARS Coronavirus 2: NEGATIVE

## 2018-08-06 LAB — PROTIME-INR
INR: 1.1 (ref 0.8–1.2)
INR: 1.2 (ref 0.8–1.2)
INR: 1.3 — ABNORMAL HIGH (ref 0.8–1.2)
Prothrombin Time: 14.4 seconds (ref 11.4–15.2)
Prothrombin Time: 15.3 seconds — ABNORMAL HIGH (ref 11.4–15.2)
Prothrombin Time: 15.7 seconds — ABNORMAL HIGH (ref 11.4–15.2)

## 2018-08-06 LAB — CK
Total CK: 401 U/L — ABNORMAL HIGH (ref 49–397)
Total CK: 432 U/L — ABNORMAL HIGH (ref 49–397)
Total CK: 453 U/L — ABNORMAL HIGH (ref 49–397)
Total CK: 533 U/L — ABNORMAL HIGH (ref 49–397)

## 2018-08-06 LAB — TROPONIN I
Troponin I: <0.03 ng/mL (ref <0.03)
Troponin I: <0.03 ng/mL (ref <0.03)

## 2018-08-06 LAB — FIBRINOGEN
Fibrinogen: 301 mg/dL (ref 210–475)
Fibrinogen: 310 mg/dL (ref 210–475)

## 2018-08-06 MED ORDER — MORPHINE SULFATE (PF) 4 MG/ML IV SOLN: 4.0000 mg | INTRAVENOUS | Status: DC | PRN

## 2018-08-06 MED ORDER — DEXTROSE-NACL 5-0.45 % IV SOLN
INTRAVENOUS | Status: DC
  Administered 2018-08-06: 16:00:00 via INTRAVENOUS

## 2018-08-06 MED ORDER — MORPHINE SULFATE (PF) 4 MG/ML IV SOLN: 8.0000 mg | Freq: Once | INTRAVENOUS | Status: DC

## 2018-08-06 MED ORDER — ONDANSETRON HCL 4 MG/2ML IJ SOLN: 4.0000 mg | Freq: Once | INTRAMUSCULAR | Status: DC

## 2018-08-06 MED ORDER — OXYCODONE-ACETAMINOPHEN 5-325 MG PO TABS
1.0000 | ORAL_TABLET | Freq: Once | ORAL | Status: AC
  Administered 2018-08-06: 18:00:00 1 via ORAL
  Filled 2018-08-06: qty 1

## 2018-08-06 MED ORDER — MORPHINE SULFATE (PF) 4 MG/ML IV SOLN
4.0000 mg | Freq: Once | INTRAVENOUS | Status: AC
  Administered 2018-08-06: 02:00:00 4 mg via INTRAVENOUS
  Filled 2018-08-06: qty 1

## 2018-08-06 MED ORDER — ACETAMINOPHEN 500 MG PO TABS: 1000.0000 mg | ORAL_TABLET | Freq: Once | ORAL | Status: DC

## 2018-08-06 MED ORDER — ALBUTEROL SULFATE (2.5 MG/3ML) 0.083% IN NEBU: 3.0000 mL | INHALATION_SOLUTION | Freq: Four times a day (QID) | RESPIRATORY_TRACT | Status: DC | PRN

## 2018-08-06 MED ORDER — CEFAZOLIN SODIUM-DEXTROSE 2-4 GM/100ML-% IV SOLN
2.0000 g | Freq: Three times a day (TID) | INTRAVENOUS | Status: DC
  Administered 2018-08-06 – 2018-08-07 (×3): 2 g via INTRAVENOUS
  Filled 2018-08-06 (×5): qty 100

## 2018-08-06 MED ORDER — ASPIRIN EC 81 MG PO TBEC
81.0000 mg | DELAYED_RELEASE_TABLET | Freq: Every day | ORAL | Status: DC
  Administered 2018-08-06: 81 mg via ORAL
  Filled 2018-08-06: qty 1

## 2018-08-06 MED ORDER — SODIUM CHLORIDE 0.9 % IV BOLUS
1000.0000 mL | Freq: Once | INTRAVENOUS | Status: AC
  Administered 2018-08-06: 01:00:00 1000 mL via INTRAVENOUS

## 2018-08-06 MED ORDER — OXYCODONE-ACETAMINOPHEN 5-325 MG PO TABS
2.0000 | ORAL_TABLET | Freq: Once | ORAL | Status: AC
  Administered 2018-08-06: 03:00:00 2 via ORAL
  Filled 2018-08-06: qty 2

## 2018-08-06 MED ORDER — ACETAMINOPHEN 325 MG PO TABS
650.0000 mg | ORAL_TABLET | Freq: Once | ORAL | Status: AC
  Administered 2018-08-06: 16:00:00 650 mg via ORAL
  Filled 2018-08-06: qty 2

## 2018-08-06 MED ORDER — DEXTROSE-NACL 5-0.45 % IV SOLN
INTRAVENOUS | Status: AC
  Administered 2018-08-06: 21:00:00 via INTRAVENOUS

## 2018-08-06 MED ORDER — SODIUM CHLORIDE 0.9 % IV SOLN
4.0000 | Freq: Once | INTRAVENOUS | Status: AC
  Administered 2018-08-06: 72 mL via INTRAVENOUS
  Filled 2018-08-06: qty 72

## 2018-08-06 MED ORDER — ACETAMINOPHEN 325 MG PO TABS
325.0000 mg | ORAL_TABLET | Freq: Once | ORAL | Status: AC
  Administered 2018-08-06: 325 mg via ORAL
  Filled 2018-08-06: qty 1

## 2018-08-06 MED ORDER — HEPARIN SODIUM (PORCINE) 5000 UNIT/ML IJ SOLN
5000.0000 [IU] | Freq: Three times a day (TID) | INTRAMUSCULAR | Status: DC
  Administered 2018-08-06 – 2018-08-07 (×3): 5000 [IU] via SUBCUTANEOUS
  Filled 2018-08-06 (×3): qty 1

## 2018-08-06 MED ORDER — SODIUM CHLORIDE 0.9 % IV SOLN
2.0000 | Freq: Once | INTRAVENOUS | Status: AC
  Administered 2018-08-06: 36 mL via INTRAVENOUS
  Filled 2018-08-06: qty 36

## 2018-08-06 MED ORDER — SODIUM CHLORIDE 0.9 % IV SOLN
4.0000 | Freq: Once | INTRAVENOUS | Status: DC
  Filled 2018-08-06: qty 72

## 2018-08-06 NOTE — ED Notes (Signed)
Ortho at bedside.

## 2018-08-06 NOTE — ED Notes (Signed)
ED TO INPATIENT HANDOFF REPORT  ED Nurse Name and Phone #:  Dorathy DaftKayla [phone]  S Name/Age/Gender Rande Lawmanarryl R Pulliam 58 y.o. male Room/Bed: 032C/032C  Code Status   Code Status: Not on file  Home/SNF/Other Home Patient oriented to: self, place, time and situation Is this baseline? Yes   Triage Complete: Triage complete  Chief Complaint Snake Bite  Triage Note Pt here for anti venom from a snake bite that occurred 2 days ago, [facility] but EDP decided pt did not need anti venom at the time. Pt called poison control this morning to report increased swelling in his right hand up his arm. Poison control recommended pt come back to ED to receive anti venom and get repeat labs.    Allergies No Known Allergies  Level of Care/Admitting Diagnosis ED Disposition    None      B Medical/Surgery History Past Medical History:  Diagnosis Date  . Asthma   . Atrial flutter [facility](HCC)    Past Surgical History:  Procedure Laterality Date  . ELECTROPHYSIOLOGIC STUDY     atrial flutter  . hernia (other)  1980s  . lumbar back surgery  1999     A IV Location/Drains/Wounds Patient Lines/Drains/Airways Status   Active Line/Drains/Airways    Name:   Placement date:   Placement time:   Site:   Days:   Peripheral IV 08/06/18 Left;Upper Arm   08/06/18    1623    Arm   less than 1   Peripheral IV 08/06/18 Left Antecubital   08/06/18    1623    Antecubital   less than 1          Intake/Output Last 24 hours No intake or output data in the 24 hours ending 08/06/18 1739  Labs/Imaging Results for orders placed or performed during the hospital encounter of 08/06/18 (from the past 48 hour(s))  Comprehensive metabolic panel     Status: Abnormal   Collection Time: 08/06/18  4:04 PM  Result Value Ref Range   Sodium 139 135 - 145 mmol/L   Potassium 4.3 3.5 - 5.1 mmol/L   Chloride 105 98 - 111 mmol/L   CO2 26 22 - 32 mmol/L   Glucose, Bld 88 70 - 99 mg/dL   BUN 16 6 - 20 mg/dL   Creatinine, Ser 3.471.22 0.61 - 1.24 mg/dL   Calcium 9.0 8.9 - 42.510.3 mg/dL   Total Protein 6.0 (L) 6.5 - 8.1 g/dL   Albumin 3.6 3.5 - 5.0 g/dL   AST 37 15 - 41 U/L   ALT 32 0 - 44 U/L   Alkaline Phosphatase 57 38 - 126 U/L   Total Bilirubin 0.8 0.3 - 1.2 mg/dL   GFR calc non Af Amer >60 >60 mL/min   GFR calc Af Amer >60 >60 mL/min   Anion gap 8 5 - 15    Comment: Performed at [facility], [address] [place], [place] [address]  CBC with Differential     Status: None   Collection Time: 08/06/18  4:04 PM  Result Value Ref Range   WBC 6.7 4.0 - 10.5 K/uL   RBC 4.50 4.22 - 5.81 MIL/uL   Hemoglobin 14.1 13.0 - 17.0 g/dL   HCT 75.643.6 43.339.0 - 29.552.0 %   MCV 96.9 80.0 - 100.0 fL   MCH 31.3 26.0 - 34.0 pg   MCHC 32.3 30.0 - 36.0 g/dL   RDW 18.812.0 41.611.5 - 60.615.5 %   Platelets  209 150 - 400 K/uL   nRBC 0.0 0.0 - 0.2 %   Neutrophils Relative % 78 %   Neutro Abs 5.2 1.7 - 7.7 K/uL   Lymphocytes Relative 13 %   Lymphs Abs 0.9 0.7 - 4.0 K/uL   Monocytes Relative 8 %   Monocytes Absolute 0.5 0.1 - 1.0 K/uL   Eosinophils Relative 1 %   Eosinophils Absolute 0.1 0.0 - 0.5 K/uL   Basophils Relative 0 %   Basophils Absolute 0.0 0.0 - 0.1 K/uL   Immature Granulocytes 0 %   Abs Immature Granulocytes 0.02 0.00 - 0.07 K/uL    Comment: Performed at [facility], [address], [place], [place] [address]  Protime-INR     Status: Abnormal   Collection Time: 08/06/18  4:04 PM  Result Value Ref Range   Prothrombin Time 15.3 (H) 11.4 - 15.2 seconds   INR 1.2 0.8 - 1.2    Comment: (NOTE) INR goal varies based on device and disease states. Performed at [facility], [address] [place], [place] [address]   CK     Status: Abnormal   Collection Time: 08/06/18  4:04 PM  Result Value Ref Range   Total CK 453 (H) 49 - 397 U/L    Comment: Performed at [facility], [address] [place], [place] [address]  Fibrinogen     Status: None   Collection Time: 08/06/18  4:04 PM  Result  Value Ref Range   Fibrinogen 310 210 - 475 mg/dL    Comment: Performed at [facility], [address] [place], [place] [address]   Dg Hand 2 View Right  Result Date: 08/05/2018 CLINICAL DATA:  Snake bite EXAM: RIGHT HAND - 2 VIEW COMPARISON:  None. FINDINGS: Calcifications adjacent to the scaphoid are identified. No overt overlying soft tissue swelling identified. No abnormalities in the region of the sick bite in the index finger. No other acute abnormalities. IMPRESSION: 1. The index finger is normal in appearance. 2. The calcifications adjacent to the scaphoid are likely remote given history. Recommend clinical correlation to exclude acute pain in the snuffbox. Electronically Signed   By: Gerome Sam III M.D   On: 08/05/2018 13:11    Pending Labs Unresulted Labs (From admission, onward)   None      Vitals/Pain Today's Vitals   08/06/18 1700 08/06/18 1715 08/06/18 1730 08/06/18 1734  BP: 138/84 136/78 135/77   Pulse: 81 71 74   Resp: (!) 22 (!) 23 20   Temp:      TempSrc:      SpO2: 97% 96% 94%   PainSc:    7     Isolation Precautions No active isolations  Medications Medications  crotalidae polyvalent immune fab (CROFAB) 4 vial in sodium chloride 0.9 % 322 mL infusion ( Intravenous Rate/Dose Change 08/06/18 1719)  dextrose 5 %-0.45 % sodium chloride infusion ( Intravenous New Bag/Given 08/06/18 1624)  acetaminophen (TYLENOL) tablet 650 mg (650 mg Oral Given 08/06/18 1624)  oxyCODONE-acetaminophen (PERCOCET/ROXICET) 5-325 MG per tablet 1 tablet (1 tablet Oral Given 08/06/18 1735)    Mobility walks Low fall risk   Focused Assessments    R Recommendations: See Admitting Provider Note  Report given to:   Additional Notes:

## 2018-08-06 NOTE — ED Notes (Signed)
Patient returns to the ED after having been seen here earlier for a copperhead bite to the right hand. Seen and treated here, discharged home but when he was sitting on the cough, became dizzy, lightheaded and was near syncopal. Became very diaphoretic and wife decided he needed to come back. Hand remains swollen. He denies feeling dizzy at this time. Alert and oriented and able to answer questions appropriately.

## 2018-08-06 NOTE — Progress Notes (Signed)
Orthopedic Tech Progress Note Patient Details:  [name] Feb 12, 1961 [id number]  Ortho Devices Type of Ortho Device: Other (comment) Ortho Device/Splint Location: carter arm pillow Ortho Device/Splint Interventions: Ordered, Application, Adjustment   Post Interventions Patient Tolerated: Well Instructions Provided: Care of device, Adjustment of device   [name] 08/06/2018, 7:55 PM

## 2018-08-06 NOTE — ED Notes (Signed)
Pt sleeping. 

## 2018-08-06 NOTE — ED Notes (Signed)
MD Allena Katz at bedside to assess arm swelling at the 1 hour post antivenom mark. He is aware that swelling is worsening in hand and will order 2 more vials of antivenom. Arm is elevated. Pulses present.

## 2018-08-06 NOTE — ED Triage Notes (Signed)
Pt here for anti venom from a snake bite that occurred 2 days ago, pt was seen at [facility] but EDP decided pt did not need anti venom at the time. Pt called poison control this morning to report increased swelling in his right hand up his arm. Poison control recommended pt come back to ED to receive anti venom and get repeat labs.

## 2018-08-06 NOTE — ED Notes (Signed)
RN entered room to start IV and draw blood. Pt reports to this RN That his wife is on the way to transport him to [facility] in [place]. RN asked pt why he felt the need to leave the ED. PT verbalized he did not feel comfortable with his treatment at [facility]. RN apologized and asked if there was anything she could do for pt at this time. Pt requesting water. Water provided. MD made aware of pt decision. MD to bedside to reiterate medication is already being mixed and will be provided as soon as it arrives from pharmacy. Pt continues to state he would feel safe at [facility] and felt offended that the price of the medication was discussed yesterday. Pt also reporting he was not pleased that poison control recommended Crofab and he did not receive it yesterday. PT is calm at this time and in NAD at time of leaving ED.

## 2018-08-06 NOTE — ED Triage Notes (Signed)
Pt was seen earlier Saturday following a copperhead bet to his right hand; pt says he was working in the yard when the incident happened; pt was watched for hours and discharged early evening;  Pt says just prior to arrival he was watching tv with his arm elevated when he became diaphoretic and almost passed out; pt says he never actually passed out; c/o pain to right arm; pt says swelling has increased since he left; pt's skin dry to touch at this time

## 2018-08-06 NOTE — ED Notes (Signed)
Fluids given and encouraged,   Pt arm appears as the same color tone as hand,   Measurements: wrist 6 7/8, forearm 11 3/8, bicep 12 7/8

## 2018-08-06 NOTE — ED Provider Notes (Signed)
[facility] Emergency Department Provider Note    First MD Initiated Contact with Patient 08/06/18 1350     (approximate)  I have reviewed the triage vital signs and the nursing notes.   HISTORY  Chief Complaint Snake Bite    HPI [name] is a 58 y.o. male with the below past medical history presents the ER for interval worsening swelling of his right hand.States he is having worsening swelling to the hand and now swelling is progressing up into his armpit causing discomfort.  Denies any shortness of breath or chest pain.  Appears overnight did initially get better but he has started to have the swelling now go above his elbow.  He does not feel that he is had any noticeable decrease in the swelling of his hand.  Poison control was contacted and recommended CroFab for interval worsening.  Was directed to the ER for repeat evaluation.    Past Medical History:  Diagnosis Date  . Asthma   . Atrial flutter (HCC)    Family History  Problem Relation Age of Onset  . Diabetes Mother   . Hyperlipidemia Mother   . Emphysema Father   . Glaucoma Father        open-angle  . Asthma Sister    Past Surgical History:  Procedure Laterality Date  . ELECTROPHYSIOLOGIC STUDY     atrial flutter  . hernia (other)  1980s  . lumbar back surgery  1999   Patient Active Problem List   Diagnosis Date Noted  . Allergic rhinitis, seasonal 11/29/2014      Prior to Admission medications   Medication Sig Start Date End Date Taking? Authorizing Provider  aspirin (ASPIR-LOW) 81 MG EC tablet Take 81 mg by mouth daily.      [provider]  Cholecalciferol (VITAMIN D) 2000 units CAPS Take 4,000 Units by mouth daily.    [provider]  KRILL OIL PO Take by mouth.    [provider]  Multiple Vitamins-Minerals (DAILY MULTI) TABS Take by mouth daily.      [provider]  oxyCODONE-acetaminophen (PERCOCET) 5-325 MG tablet Take 1  tablet by mouth every 4 (four) hours as needed. 08/05/18   Nita Sickle, MD  Turmeric 500 MG CAPS Take 1,000 mg by mouth daily.    [provider]    Allergies Patient has no known allergies.    Social History Social History   Tobacco Use  . Smoking status: Never Smoker  . Smokeless tobacco: Never Used  Substance Use Topics  . Alcohol use: No  . Drug use: No    Review of Systems Patient denies headaches, rhinorrhea, blurry vision, numbness, shortness of breath, chest pain, edema, cough, abdominal pain, nausea, vomiting, diarrhea, dysuria, fevers, rashes or hallucinations unless otherwise stated above in HPI. ____________________________________________   PHYSICAL EXAM:  VITAL SIGNS: Vitals:   08/06/18 1351  BP: 132/84  Resp: 17  Temp: 98.7 F (37.1 C)  SpO2: 93%    Constitutional: Alert and oriented.  Eyes: Conjunctivae are normal.  Head: Atraumatic. Nose: No congestion/rhinnorhea. Mouth/Throat: Mucous membranes are moist.   Neck: No stridor. Painless ROM.  Cardiovascular: Normal rate, regular rhythm. Grossly normal heart sounds.  Good peripheral circulation. Respiratory: Normal respiratory effort.  No retractions. Lungs CTAB. Gastrointestinal: Soft and nontender. No distention. No abdominal bruits. No CVA tenderness. Genitourinary:  Musculoskeletal: Does appear to have interval worsening of swelling of his right hand now above the elbow going into the armpit.  Compartments soft and able to move fingers.  Ttp.  No overlying erythema.  Neurovascularly intact distally.  No lower extremity tenderness nor edema.  No joint effusions. Neurologic:  Normal speech and language. No gross focal neurologic deficits are appreciated. No facial droop Skin:  Skin is warm, dry and intact. No rash noted. Psychiatric: Mood and affect are normal. Speech and behavior are normal.  ____________________________________________   LABS (all labs ordered are listed, but only  abnormal results are displayed)  Results for orders placed or performed during the hospital encounter of 08/06/18 (from the past 24 hour(s))  Basic metabolic panel     Status: Abnormal   Collection Time: 08/06/18 12:19 AM  Result Value Ref Range   Sodium 140 135 - 145 mmol/L   Potassium 3.4 (L) 3.5 - 5.1 mmol/L   Chloride 106 98 - 111 mmol/L   CO2 26 22 - 32 mmol/L   Glucose, Bld 145 (H) 70 - 99 mg/dL   BUN 14 6 - 20 mg/dL   Creatinine, Ser 1.610.87 0.61 - 1.24 mg/dL   Calcium 8.8 (L) 8.9 - 10.3 mg/dL   GFR calc non Af Amer >60 >60 mL/min   GFR calc Af Amer >60 >60 mL/min   Anion gap 8 5 - 15  CBC WITH DIFFERENTIAL     Status: None   Collection Time: 08/06/18 12:19 AM  Result Value Ref Range   WBC 7.1 4.0 - 10.5 K/uL   RBC 4.90 4.22 - 5.81 MIL/uL   Hemoglobin 15.6 13.0 - 17.0 g/dL   HCT 09.645.8 04.539.0 - 40.952.0 %   MCV 93.5 80.0 - 100.0 fL   MCH 31.8 26.0 - 34.0 pg   MCHC 34.1 30.0 - 36.0 g/dL   RDW 81.111.9 91.411.5 - 78.215.5 %   Platelets 208 150 - 400 K/uL   nRBC 0.0 0.0 - 0.2 %   Neutrophils Relative % 63 %   Neutro Abs 4.5 1.7 - 7.7 K/uL   Lymphocytes Relative 27 %   Lymphs Abs 1.9 0.7 - 4.0 K/uL   Monocytes Relative 7 %   Monocytes Absolute 0.5 0.1 - 1.0 K/uL   Eosinophils Relative 2 %   Eosinophils Absolute 0.1 0.0 - 0.5 K/uL   Basophils Relative 1 %   Basophils Absolute 0.0 0.0 - 0.1 K/uL   Immature Granulocytes 0 %   Abs Immature Granulocytes 0.02 0.00 - 0.07 K/uL  Protime-INR     Status: None   Collection Time: 08/06/18 12:19 AM  Result Value Ref Range   Prothrombin Time 14.4 11.4 - 15.2 seconds   INR 1.1 0.8 - 1.2  Fibrinogen     Status: None   Collection Time: 08/06/18 12:19 AM  Result Value Ref Range   Fibrinogen 301 210 - 475 mg/dL  APTT     Status: None   Collection Time: 08/06/18 12:19 AM  Result Value Ref Range   aPTT 31 24 - 36 seconds  Troponin I - ONCE - STAT     Status: None   Collection Time: 08/06/18 12:19 AM  Result Value Ref Range   Troponin I <0.03 <0.03  ng/mL  CK     Status: Abnormal   Collection Time: 08/06/18 12:19 AM  Result Value Ref Range   Total CK 533 (H) 49 - 397 U/L  CK     Status: Abnormal   Collection Time: 08/06/18  4:50 AM  Result Value Ref Range   Total CK 432 (H) 49 - 397 U/L  Troponin I - Once-Timed     Status: None   Collection Time: 08/06/18  4:50 AM  Result Value Ref Range   Troponin I <0.03 <0.03 ng/mL  CBC with Differential/Platelet     Status: None   Collection Time: 08/06/18  4:50 AM  Result Value Ref Range   WBC 6.3 4.0 - 10.5 K/uL   RBC 4.42 4.22 - 5.81 MIL/uL   Hemoglobin 14.0 13.0 - 17.0 g/dL   HCT 44.0 34.7 - 42.5 %   MCV 95.2 80.0 - 100.0 fL   MCH 31.7 26.0 - 34.0 pg   MCHC 33.3 30.0 - 36.0 g/dL   RDW 95.6 38.7 - 56.4 %   Platelets 193 150 - 400 K/uL   nRBC 0.0 0.0 - 0.2 %   Neutrophils Relative % 69 %   Neutro Abs 4.4 1.7 - 7.7 K/uL   Lymphocytes Relative 21 %   Lymphs Abs 1.3 0.7 - 4.0 K/uL   Monocytes Relative 7 %   Monocytes Absolute 0.5 0.1 - 1.0 K/uL   Eosinophils Relative 1 %   Eosinophils Absolute 0.1 0.0 - 0.5 K/uL   Basophils Relative 1 %   Basophils Absolute 0.0 0.0 - 0.1 K/uL   Immature Granulocytes 1 %   Abs Immature Granulocytes 0.03 0.00 - 0.07 K/uL   ____________________________________________ ____________________________________________  RADIOLOGY   ____________________________________________   PROCEDURES  Procedure(s) performed:  Procedures    Critical Care performed: no ____________________________________________   INITIAL IMPRESSION / ASSESSMENT AND PLAN / ED COURSE  Pertinent labs & imaging results that were available during my care of the patient were reviewed by me and considered in my medical decision making (see chart for details).   DDX: Snake bite, cellulitis, edema  [name] is a 58 y.o. who presents to the ED with initial improvement and stabilization of right hand edema and swelling after snakebite yesterday appeared to be initially  improving and now with interval worsening and pain.  At this point edema is now progressing up above his right elbow.  The compartment is not overtly tense but is having worsening pain and does have notable swelling.  Repeat bite severity score of 3 based on local wound alone.  I recommended repeat blood work.  We discussed risks benefits of CroFab and patient consented to receive crofab given the for worsening of the swelling.  Patient initially agreeable but then states that he would prefer to get a second opinion at [facility].  I informed the patient that [facility] would cause further delay in administration of crofab and evaluation of underlying coagulopathy.  Patient states he understands possible further delay but will not accept any further medical treatment at this facility.  I tried to convince patient to stay for for further evaluation and management but he had called his wife to take him to [place].    The patient was evaluated in Emergency Department today for the symptoms described in the history of present illness. He/she was evaluated in the context of the global COVID-19 pandemic, which necessitated consideration that the patient might be at risk for infection with the SARS-CoV-2 virus that causes COVID-19. Institutional protocols and algorithms that pertain to the evaluation of patients at risk for COVID-19 are in a state of rapid change based on information released by regulatory bodies including the CDC and federal and state organizations. These policies and algorithms were followed during the patient's care in the ED.  As part of my medical decision  making, I reviewed the following data within the electronic MEDICAL RECORD NUMBER Nursing notes reviewed and incorporated, Labs reviewed, notes from prior ED visits.   ____________________________________________   FINAL CLINICAL IMPRESSION(S) / ED DIAGNOSES  Final diagnoses:  Snake bite, initial encounter      NEW MEDICATIONS  STARTED DURING THIS VISIT:  Discharge Medication List as of 08/06/2018  2:45 PM       Note:  This document was prepared using Dragon voice recognition software and may include unintentional dictation errors.    Willy Eddy, MD 08/06/18 1517

## 2018-08-06 NOTE — ED Notes (Addendum)
ED TO INPATIENT HANDOFF REPORT  ED Nurse Name and Phone #:  Dorathy Daft [phone]  S Name/Age/Gender [name] 58 y.o. male Room/Bed: 032C/032C  Code Status   Code Status: Not on file  Home/SNF/Other Home Patient oriented to: self, place, time and situation Is this baseline? Yes   Triage Complete: Triage complete  Chief Complaint Snake Bite  Triage Note Pt here for anti venom from a snake bite that occurred 2 days ago, pt was seen at [facility] but EDP decided pt did not need anti venom at the time. Pt called poison control this morning to report increased swelling in his right hand up his arm. Poison control recommended pt come back to ED to receive anti venom and get repeat labs.    Allergies No Known Allergies  Level of Care/Admitting Diagnosis ED Disposition    ED Disposition Condition Comment   Admit  Hospital Area: [facility] [[id number]]  Level of Care: Med-Surg [16]  I expect the patient will be discharged within 24 hours: No (not a candidate for 5C-Observation unit)  Covid Evaluation: Screening Protocol (No Symptoms)  Diagnosis: Snake bite [967591]  Admitting Physician: [name] [[id number]]  Attending Physician: [name] [[id number]]  PT Class (Do Not Modify): Observation [104]  PT Acc Code (Do Not Modify): Observation [10022]       B Medical/Surgery History Past Medical History:  Diagnosis Date  . Asthma   . Atrial flutter [facility])    Past Surgical History:  Procedure Laterality Date  . ELECTROPHYSIOLOGIC STUDY     atrial flutter  . hernia (other)  1980s  . lumbar back surgery  1999     A IV Location/Drains/Wounds Patient Lines/Drains/Airways Status   Active Line/Drains/Airways    Name:   Placement date:   Placement time:   Site:   Days:   Peripheral IV 08/06/18 Left;Upper Arm   08/06/18    1623    Arm   less than 1   Peripheral IV 08/06/18 Left Antecubital   08/06/18    1623    Antecubital   less than 1           Intake/Output Last 24 hours No intake or output data in the 24 hours ending 08/06/18 1928  Labs/Imaging Results for orders placed or performed during the hospital encounter of 08/06/18 (from the past 48 hour(s))  Comprehensive metabolic panel     Status: Abnormal   Collection Time: 08/06/18  4:04 PM  Result Value Ref Range   Sodium 139 135 - 145 mmol/L   Potassium 4.3 3.5 - 5.1 mmol/L   Chloride 105 98 - 111 mmol/L   CO2 26 22 - 32 mmol/L   Glucose, Bld 88 70 - 99 mg/dL   BUN 16 6 - 20 mg/dL   Creatinine, Ser 7.79 0.61 - 1.24 mg/dL   Calcium 9.0 8.9 - 39.0 mg/dL   Total Protein 6.0 (L) 6.5 - 8.1 g/dL   Albumin 3.6 3.5 - 5.0 g/dL   AST 37 15 - 41 U/L   ALT 32 0 - 44 U/L   Alkaline Phosphatase 57 38 - 126 U/L   Total Bilirubin 0.8 0.3 - 1.2 mg/dL   GFR calc non Af Amer >60 >60 mL/min   GFR calc Af Amer >60 >60 mL/min   Anion gap 8 5 - 15    Comment: Performed at [facility], [address], [place], [place] [address]  CBC with Differential  Status: None   Collection Time: 08/06/18  4:04 PM  Result Value Ref Range   WBC 6.7 4.0 - 10.5 K/uL   RBC 4.50 4.22 - 5.81 MIL/uL   Hemoglobin 14.1 13.0 - 17.0 g/dL   HCT 40.943.6 81.139.0 - 91.452.0 %   MCV 96.9 80.0 - 100.0 fL   MCH 31.3 26.0 - 34.0 pg   MCHC 32.3 30.0 - 36.0 g/dL   RDW 78.212.0 95.611.5 - 21.315.5 %   Platelets 209 150 - 400 K/uL   nRBC 0.0 0.0 - 0.2 %   Neutrophils Relative % 78 %   Neutro Abs 5.2 1.7 - 7.7 K/uL   Lymphocytes Relative 13 %   Lymphs Abs 0.9 0.7 - 4.0 K/uL   Monocytes Relative 8 %   Monocytes Absolute 0.5 0.1 - 1.0 K/uL   Eosinophils Relative 1 %   Eosinophils Absolute 0.1 0.0 - 0.5 K/uL   Basophils Relative 0 %   Basophils Absolute 0.0 0.0 - 0.1 K/uL   Immature Granulocytes 0 %   Abs Immature Granulocytes 0.02 0.00 - 0.07 K/uL    Comment: Performed at [facility], [address] [place], [place] [address]  Protime-INR     Status: Abnormal   Collection Time: 08/06/18  4:04 PM  Result Value Ref  Range   Prothrombin Time 15.3 (H) 11.4 - 15.2 seconds   INR 1.2 0.8 - 1.2    Comment: (NOTE) INR goal varies based on device and disease states. Performed at [facility], [address] [place], [place] [address]   CK     Status: Abnormal   Collection Time: 08/06/18  4:04 PM  Result Value Ref Range   Total CK 453 (H) 49 - 397 U/L    Comment: Performed at [facility], [address] [place], [place] [address]  Fibrinogen     Status: None   Collection Time: 08/06/18  4:04 PM  Result Value Ref Range   Fibrinogen 310 210 - 475 mg/dL    Comment: Performed at [facility], [address] [place], [place] [address]   Dg Hand 2 View Right  Result Date: 08/05/2018 CLINICAL DATA:  Snake bite EXAM: RIGHT HAND - 2 VIEW COMPARISON:  None. FINDINGS: Calcifications adjacent to the scaphoid are identified. No overt overlying soft tissue swelling identified. No abnormalities in the region of the sick bite in the index finger. No other acute abnormalities. IMPRESSION: 1. The index finger is normal in appearance. 2. The calcifications adjacent to the scaphoid are likely remote given history. Recommend clinical correlation to exclude acute pain in the snuffbox. Electronically Signed   By: Gerome Samavid  Williams III M.D   On: 08/05/2018 13:11    Pending Labs Unresulted Labs (From admission, onward)    Start     Ordered   08/06/18 1917  SARS Coronavirus 2 (CEPHEID - Performed in [facility] lab), [facility] Order  (Asymptomatic Patients Labs)  Once,   R    Question:  Rule Out  Answer:  Yes   08/06/18 1917          Vitals/Pain Today's Vitals   08/06/18 1734 08/06/18 1815 08/06/18 1845 08/06/18 1900  BP:  126/84 131/85 138/84  Pulse:   63   Resp:  20 20 20   Temp:      TempSrc:      SpO2:   93% 96%  PainSc: 7        Isolation Precautions No  active isolations  Medications Medications  dextrose 5 %-0.45 % sodium chloride infusion ( Intravenous New Bag/Given 08/06/18 1624)   crotalidae polyvalent immune fab (CROFAB) 4 vial in sodium chloride 0.9 % 322 mL infusion (0 mLs Intravenous Stopped 08/06/18 1848)  acetaminophen (TYLENOL) tablet 650 mg (650 mg Oral Given 08/06/18 1624)  oxyCODONE-acetaminophen (PERCOCET/ROXICET) 5-325 MG per tablet 1 tablet (1 tablet Oral Given 08/06/18 1735)    Mobility walks Low fall risk   Focused Assessments    R Recommendations: See Admitting Provider Note  Report given to: Nellie RN  Additional Notes:

## 2018-08-06 NOTE — ED Triage Notes (Signed)
Pt referred by to the ED by poison control due to increased swelling caused by a copper head bite that occurred yesterday around 11am.

## 2018-08-06 NOTE — ED Provider Notes (Signed)
[facility] Emergency Department Provider Note  ____________________________________________   None    (approximate)  I have reviewed the triage vital signs and the nursing notes.   HISTORY  Chief Complaint Loss of Consciousness    HPI [name] is a 58 y.o. male with medical history as listed below who is not on any anticoagulation and presents for evaluation of near syncope in the setting of a recent snakebite  to his right hand.  He has 1 puncture wound on the index finger that occurred through a glove when he was doing some gardening earlier this morning.  He came into the emergency department and was evaluated and observed for a number of hours and was discharged as the swelling was improving.  No CroFab was administered.   He reports that after he got home he was resting on the couch.  He reports that he has continued to elevate his right hand and arm.  He said that while he was watching TV he had acute onset of sweating, lightheadedness, and he felt like he was going to pass out.  When he stood up he felt "like everything was moving in slow motion".  He did not actually lose consciousness and his symptoms revolved very quickly.  He denies fever/chills, sore throat, chest pain, shortness of breath, nausea, vomiting, and abdominal pain.  He said that the swelling in his hand and arm have gotten worse since he left the ED and he has pain with any kind of flexion of the hand.  The pain extends up to about his elbow and there is some swelling up to the elbow although most of the swelling is confined to his right hand.    He describes the onset of the event where he nearly passed out is acute and severe although now he feels better with just the persistent pain in his hand.  He has no history of coronary artery disease.  He has a history of atrial fibrillation or flutter which was corrected years ago with ablation.  He takes a baby aspirin daily but no other  anticoagulation.        Past Medical History:  Diagnosis Date  . Asthma   . Atrial flutter [facility])     Patient Active Problem List   Diagnosis Date Noted  . Allergic rhinitis, seasonal 11/29/2014    Past Surgical History:  Procedure Laterality Date  . ELECTROPHYSIOLOGIC STUDY     atrial flutter  . hernia (other)  1980s  . lumbar back surgery  1999    Prior to Admission medications   Medication Sig Start Date End Date Taking? Authorizing Provider  aspirin (ASPIR-LOW) 81 MG EC tablet Take 81 mg by mouth daily.     Yes [provider]  Cholecalciferol (VITAMIN D) 2000 units CAPS Take 4,000 Units by mouth daily.   Yes [provider]  KRILL OIL PO Take by mouth.   Yes [provider]  Multiple Vitamins-Minerals (DAILY MULTI) TABS Take by mouth daily.     Yes [provider]  oxyCODONE-acetaminophen (PERCOCET) 5-325 MG tablet Take 1 tablet by mouth every 4 (four) hours as needed. 08/05/18  Yes Veronese, [place], MD  Turmeric 500 MG CAPS Take 1,000 mg by mouth daily.   Yes [provider]    Allergies Patient has no known allergies.  Family History  Problem Relation Age of Onset  . Diabetes Mother   . Hyperlipidemia Mother   . Emphysema Father   .  Glaucoma Father        open-angle  . Asthma Sister     Social History Social History   Tobacco Use  . Smoking status: Never Smoker  . Smokeless tobacco: Never Used  Substance Use Topics  . Alcohol use: No  . Drug use: No    Review of Systems Constitutional: No fever/chills Eyes: No visual changes. ENT: No sore throat. Cardiovascular: Near syncopal episode with diaphoresis while at rest at home on the couch.  Denies chest pain. Respiratory: Denies shortness of breath. Gastrointestinal: No abdominal pain.  No nausea, no vomiting.  No diarrhea.  No constipation. Genitourinary: Negative for dysuria. Musculoskeletal: Pain and swelling in the right hand and arm after snakebite  as described above.  Negative for neck pain.  Negative for back pain. Integumentary: Negative for rash. Neurological: Negative for headaches, focal weakness or numbness.   ____________________________________________   PHYSICAL EXAM:  ED Triage Vitals  Enc Vitals Group     BP 08/06/18 0030 121/78     Pulse Rate 08/06/18 0016 71     Resp 08/06/18 0016 16     Temp --      Temp Source 08/06/18 0016 Oral     SpO2 08/06/18 0030 92 %     Weight 08/06/18 0021 86 kg (189 lb 9.5 oz)     Height 08/06/18 0021 1.778 m (5\' 10" )     Head Circumference --      Peak Flow --      Pain Score 08/06/18 0021 4     Pain Loc --      Pain Edu? --      Excl. in GC? --      Constitutional: Alert and oriented. Well appearing and in no acute distress. Eyes: Conjunctivae are normal. PERRL. EOMI. Head: Atraumatic. Nose: No congestion/rhinnorhea. Mouth/Throat: Mucous membranes are moist. Neck: No stridor.  No meningeal signs.   Cardiovascular: Normal rate, regular rhythm. Good peripheral circulation. Grossly normal heart sounds. Respiratory: Normal respiratory effort.  No retractions. No audible wheezing. Gastrointestinal: Soft and nontender. No distention.  Musculoskeletal: The patient has obvious edema to his entire right hand that continues up his arm but becomes less noticeable as it reaches the elbow.  He reports some tenderness above the elbow but the symptoms are worse closer to the hand.  He is able to flex his hand into a fist but it hurts to do so.  Although his hand is swollen and somewhat tight, the compartments are still soft and easily compressible throughout his hand and arm.  No evidence of cellulitis.  There is 1 small puncture wound on the the ulnar side of the DIP where he says he was bitten.  See nursing notes for measurement details. Neurologic:  Normal speech and language. No gross focal neurologic deficits are appreciated.  Skin:  Skin is warm, dry and intact.  See musculoskeletal  exam above. Psychiatric: Mood and affect are normal. Speech and behavior are normal.  ____________________________________________   LABS (all labs ordered are listed, but only abnormal results are displayed)  Labs Reviewed  BASIC METABOLIC PANEL - Abnormal; Notable for the following components:      Result Value   Potassium 3.4 (*)    Glucose, Bld 145 (*)    Calcium 8.8 (*)    All other components within normal limits  CK - Abnormal; Notable for the following components:   Total CK 533 (*)    All other components within normal limits  CK -  Abnormal; Notable for the following components:   Total CK 432 (*)    All other components within normal limits  CBC WITH DIFFERENTIAL/PLATELET  PROTIME-INR  FIBRINOGEN  APTT  TROPONIN I  TROPONIN I  CBC WITH DIFFERENTIAL/PLATELET   ____________________________________________  EKG  ED ECG REPORT I, Loleta Rose, the attending physician, personally viewed and interpreted this ECG.  Date: 08/06/2018 EKG Time: 00: 16 Rate: 69 Rhythm: Sinus rhythm with first-degree AV block QRS Axis: normal Intervals: PR interval is 248 ms, otherwise unremarkable. ST/T Wave abnormalities: No significant or concerning ST or T wave changes. Narrative Interpretation: no definitive evidence of acute ischemia; does not meet STEMI criteria.   ____________________________________________  RADIOLOGY   ED MD interpretation:  No indication for new imaging during this visit.  Official radiology report(s):   ____________________________________________   PROCEDURES   Procedure(s) performed (including Critical Care):  Procedures   ____________________________________________   INITIAL IMPRESSION / MDM / ASSESSMENT AND PLAN / ED COURSE  As part of my medical decision making, I reviewed the following data within the electronic MEDICAL RECORD NUMBER Nursing notes reviewed and incorporated, Labs reviewed , EKG interpreted , Old chart reviewed, Notes  from prior ED visits and [place] Controlled Substance Database      *[name] was evaluated in Emergency Department on 08/06/2018 for the symptoms described in the history of present illness. He was evaluated in the context of the global COVID-19 pandemic, which necessitated consideration that the patient might be at risk for infection with the SARS-CoV-2 virus that causes COVID-19. Institutional protocols and algorithms that pertain to the evaluation of patients at risk for COVID-19 are in a state of rapid change based on information released by regulatory bodies including the CDC and federal and state organizations. These policies and algorithms were followed during the patient's care in the ED.*  Differential diagnosis includes, but is not limited to, vasovagal episode, other nonspecific cardiogenic syncope, rhabdomyolysis, worsening snakebite envenomation, compartment syndrome, less likely infection.  The patient is well-appearing and in no distress with normal vital signs.  I have ordered the lab work indicated by the snakebite order set as well as a CK and troponin, but his symptoms are very consistent with a vasovagal episode.  He has had a difficult and stressful day and was working out in the yard in the hot weather when he was bitten by a snake.  His oral intake has been less than usual.  I am giving him 1 L normal saline IV bolus and we will continue to monitor.  At this point I do not feel he meets criteria for CroFab administration given the potentially severe side effects of the CroFab and the fact that the envenomation occurred nearly 15 hours ago.  I discussed this with the patient and he agrees that he does not want the CroFab at this time; he had an extended conversation with Dr. Roxan Hockey about this previously.  We are remeasuring his arm and keeping his hand and arm elevated at this time.    Clinical Course as of Aug 06 634  Wynelle Link Aug 06, 2018  0154 Slightly elevated of CK.   Anticipate repeat after fluids.  CK Total(!): 533 [CF]  0221 Patient reporting pain, I ordered morphine 4 mg IV.   [CF]  0222 Patient still having pain, said Tylenol helped before.  Ordering 2 Percocet and Tylenol 325   [CF]  [id number] Patient has been stable through the night.  I am going to recheck a  CK, CBC with differential, and a troponin, but anticipate you should be appropriate for discharge.  He has had no subjective increase of his arm swelling.   [CF]  0532 CK coming down.  Repeat troponin negative.  Patient moving his hand, no significant increase in swelling.  Patient should be appropriate for discharge and outpatient follow up as planned previously. I will give him follow up information with Orthopedics (specifically a hand specialist).  Gave usual/customary return precautions.  CK Total(!): 432 [CF]  0630 Of note, the arm swelling has been stable throughout the night and there is some discrepancy based on the location that the measurements are being made but overall the swelling is certainly no worse and seems better with good mobility of the hand.  No indication for further period of observation or transfer.   [CF]    Clinical Course User Index [CF] Loleta Rose, MD     ____________________________________________  FINAL CLINICAL IMPRESSION(S) / ED DIAGNOSES  Final diagnoses:  Near syncope  Snake bite, subsequent encounter     MEDICATIONS GIVEN DURING THIS VISIT:  Medications  sodium chloride 0.9 % bolus 1,000 mL (0 mLs Intravenous Stopped 08/06/18 0148)  morphine 4 MG/ML injection 4 mg (4 mg Intravenous Given 08/06/18 0159)  oxyCODONE-acetaminophen (PERCOCET/ROXICET) 5-325 MG per tablet 2 tablet (2 tablets Oral Given 08/06/18 0241)  acetaminophen (TYLENOL) tablet 325 mg (325 mg Oral Given 08/06/18 0241)     ED Discharge Orders    None       Note:  This document was prepared using Dragon voice recognition software and may include unintentional dictation errors.    Loleta Rose, MD 08/06/18 [id number]

## 2018-08-06 NOTE — ED Notes (Signed)
This RN paged MD Allena Katz to relay info from poison control, per them pt needs to have redness and swelling marked at the hour mark after antivenom finished (1948) and if increasing they recommend 2 further vials. MD Allena Katz asked this RN to page him with results of swelling.  Will continue to monitor. Radial pulse present in right extremity.

## 2018-08-06 NOTE — ED Provider Notes (Signed)
[facility] Provider Note   CSN: [id number] Arrival date & time: 08/06/18  1506    History   Chief Complaint Chief Complaint  Patient presents with   Snake Bite    HPI [name] is a 58 y.o. [name] with history of asthma and atrial flutter presenting for evaluation of acute onset, progressively worsening swelling of the right upper extremity secondary to snakebite yesterday morning.  He reports that at around 11 AM while gardening he was bitten by a copperhead snake at the right second digit.  He was seen and evaluated multiple times at [facility] emergency department between yesterday and today and observed.  Initially did not meet criteria to receive CroFab.  He reports that since his initial presentation, the swelling has worsened.  He notes his initial pains have improved and now notes throbbing pain that worsens with palpation.  The swelling to the hand has worsened.  He also notes lymphadenopathy at the right axilla.  Had an episode of near syncope yesterday which involved lightheadedness, nausea, diaphoresis.  No numbness or weakness to the extremity but does report he has significant difficulty making a fist due to the swelling.  Denies chest pain, shortness of breath, difficulty breathing or swallowing, fevers, diarrhea, or abdominal pain.  Poison control followed up with him today at around noon and recommended going to the ED to receive CroFab.  He went to [facility] once again but then decided that he would like to get a second opinion at [facility] and left [place] regional emergency department prior to receiving any additional interventions including CroFab. He is left-hand dominant. She showed me a picture of the snake on his phone which does appear to be a copperhead.     The history is provided by the patient.    Past Medical History:  Diagnosis Date   Asthma    Atrial flutter [facility](HCC)      Patient Active Problem List   Diagnosis Date Noted   Snake bite 08/06/2018   Allergic rhinitis, seasonal 11/29/2014    Past Surgical History:  Procedure Laterality Date   ELECTROPHYSIOLOGIC STUDY     atrial flutter   hernia (other)  1980s   lumbar back surgery  1999        Home Medications    Prior to Admission medications   Medication Sig Start Date End Date Taking? Authorizing Provider  aspirin (ASPIR-LOW) 81 MG EC tablet Take 81 mg by mouth daily.     Yes [provider]  Cholecalciferol (VITAMIN D) 2000 units CAPS Take 4,000 Units by mouth daily.   Yes [provider]  KRILL OIL PO Take by mouth.   Yes [provider]  Multiple Vitamins-Minerals (DAILY MULTI) TABS Take by mouth daily.     Yes [provider]  oxyCODONE-acetaminophen (PERCOCET) 5-325 MG tablet Take 1 tablet by mouth every 4 (four) hours as needed. 08/05/18  Yes Veronese, [place], MD  Turmeric 500 MG CAPS Take 1,000 mg by mouth daily.   Yes [provider]    Family History Family History  Problem Relation Age of Onset   Diabetes Mother    Hyperlipidemia Mother    Emphysema Father    Glaucoma Father        open-angle   Asthma Sister     Social History Social History   Tobacco Use   Smoking status: Never Smoker   Smokeless tobacco: Never Used  Substance Use Topics  Alcohol use: No   Drug use: No     Allergies   Patient has no known allergies.   Review of Systems Review of Systems  Constitutional: Positive for diaphoresis. Negative for fever.  Respiratory: Negative for shortness of breath.   Cardiovascular: Negative for chest pain.  Gastrointestinal: Positive for nausea. Negative for abdominal pain and vomiting.  Musculoskeletal: Positive for arthralgias and myalgias.  Skin: Positive for color change and wound.  Neurological: Negative for weakness and numbness.  All other systems reviewed and are  negative.    Physical Exam Updated Vital Signs BP 126/84    Pulse 74    Temp 98.9 F (37.2 C) (Oral)    Resp 20    SpO2 94%   Physical Exam Vitals signs and nursing note reviewed.  Constitutional:      General: He is not in acute distress.    Appearance: He is well-developed.  HENT:     Head: Normocephalic and atraumatic.  Eyes:     General:        Right eye: No discharge.        Left eye: No discharge.     Conjunctiva/sclera: Conjunctivae normal.  Neck:     Musculoskeletal: Normal range of motion and neck supple.     Vascular: No JVD.     Trachea: No tracheal deviation.  Cardiovascular:     Rate and Rhythm: Regular rhythm. Tachycardia present.     Pulses: Normal pulses.     Heart sounds: Normal heart sounds.  Pulmonary:     Effort: Pulmonary effort is normal.     Breath sounds: Normal breath sounds.  Abdominal:     General: Abdomen is flat. Bowel sounds are normal. There is no distension.     Palpations: Abdomen is soft.     Tenderness: There is no abdominal tenderness. There is no guarding or rebound.  Musculoskeletal:        General: Swelling and tenderness present.     Comments: Significant swelling and tenderness to the right hand, with extension of swelling and some erythematous streaking up the right upper extremity. Cannot make a closed fist due to swelling. No crepitus. 5/5 strength of BUE major muscle groups. Right axillary lymphadenopathy.   Lymphadenopathy:     Cervical: No cervical adenopathy.  Skin:    General: Skin is warm and dry.     Findings: Erythema present.  Neurological:     Mental Status: He is alert.     Comments: Sensation intact to soft touch of bilateral upper extremities.   Psychiatric:        Behavior: Behavior normal.      ED Treatments / Results  Labs (all labs ordered are listed, but only abnormal results are displayed) Labs Reviewed  COMPREHENSIVE METABOLIC PANEL - Abnormal; Notable for the following components:      Result  Value   Total Protein 6.0 (*)    All other components within normal limits  PROTIME-INR - Abnormal; Notable for the following components:   Prothrombin Time 15.3 (*)    All other components within normal limits  CK - Abnormal; Notable for the following components:   Total CK 453 (*)    All other components within normal limits  CBC WITH DIFFERENTIAL/PLATELET  FIBRINOGEN    EKG EKG Interpretation  Date/Time:  Sunday Aug 06 2018 16:08:15 EDT Ventricular Rate:  81 PR Interval:    QRS Duration: 100 QT Interval:  381 QTC Calculation: 443 R Axis:  52 Text Interpretation:  Sinus rhythm Prolonged PR interval Probable left atrial enlargement Abnormal R-wave progression, late transition Minimal ST elevation, inferior leads No acute changes No significant change since last tracing Confirmed by Derwood Kaplan [phone]) on 08/06/2018 5:10:05 PM   Radiology Dg Hand 2 View Right  Result Date: 08/05/2018 CLINICAL DATA:  Snake bite EXAM: RIGHT HAND - 2 VIEW COMPARISON:  None. FINDINGS: Calcifications adjacent to the scaphoid are identified. No overt overlying soft tissue swelling identified. No abnormalities in the region of the sick bite in the index finger. No other acute abnormalities. IMPRESSION: 1. The index finger is normal in appearance. 2. The calcifications adjacent to the scaphoid are likely remote given history. Recommend clinical correlation to exclude acute pain in the snuffbox. Electronically Signed   By: Gerome Sam III M.D   On: 08/05/2018 13:11    Procedures .Critical Care Performed by: Jeanie Sewer, PA-C Authorized by: Jeanie Sewer, PA-C   Critical care provider statement:    Critical care time (minutes):  40   Critical care was necessary to treat or prevent imminent or life-threatening deterioration of the following conditions:  Toxidrome (snake bite requiring antivenom)   Critical care was time spent personally by me on the following activities:  Discussions with  consultants, evaluation of patient's response to treatment, examination of patient, ordering and performing treatments and interventions, ordering and review of laboratory studies, ordering and review of radiographic studies, pulse oximetry, re-evaluation of patient's condition, obtaining history from patient or surrogate and review of old charts   I assumed direction of critical care for this patient from another provider in my specialty: no     (including critical care time)  Medications Ordered in ED Medications  dextrose 5 %-0.45 % sodium chloride infusion ( Intravenous New Bag/Given 08/06/18 1624)  crotalidae polyvalent immune fab (CROFAB) 4 vial in sodium chloride 0.9 % 322 mL infusion (0 mLs Intravenous Stopped 08/06/18 1848)  acetaminophen (TYLENOL) tablet 650 mg (650 mg Oral Given 08/06/18 1624)  oxyCODONE-acetaminophen (PERCOCET/ROXICET) 5-325 MG per tablet 1 tablet (1 tablet Oral Given 08/06/18 1735)     Initial Impression / Assessment and Plan / ED Course  I have reviewed the triage vital signs and the nursing notes.  Pertinent labs & imaging results that were available during my care of the patient were reviewed by me and considered in my medical decision making (see chart for details).        Patient presenting at the recommendation of poison control for CroFab after copperhead bite yesterday morning while gardening.  He is afebrile, initially somewhat tachycardic.  Vital signs otherwise stable.  He is neurovascularly intact though some limitation of examination due to swelling of the right upper extremity.  Lab work obtained in the ED today reviewed by me shows no significant changes compared to labs obtained at [facility] emergency department earlier today and yesterday.  CK elevated.  Snakebite severity score of 5, meets criteria to receive Crofab and will require admission to the hospital for further monitoring and possible repeat doses.   6:34PM Spoke with Dr. Allena Katz  with [facility] hospitalist service who requests orthopedic consultation but agrees to assume care of patient and bring him into the hospital for further evaluation and management.  6:58PM Spoke with Dr. Eulah Pont with orthopedics who will see the patient in consultation during his admission.     Vaccination: Update tetanus vaccine information. If last vaccination >10 years or unknown, give tetanus booster, Tdap. Criteria for administration  of CroFab: CroFab is indicated to treat any local or systemic signs of envenomation such as pain, swelling, fasciculation, paresthesias, or measured hematologic abnormalities (thrombocytopenia, hypofibrinoginemia, or prothrombin time elevations).  Criteria                                                                                                                                                                         Pulmonary Symptoms: 0 - No symptoms/signs  1 - Dyspnea, minimal chest tightness, mild or vague discomfort, or respirations of 20-25 breaths/minute 2 - Moderate respiratory distress (tachypnea, 26-40 breaths/minute; accessory muscle use) 3 - Cyanosis, air hunger, extreme tachypnea, or respiratory insufficiency/failure (3)  Cardiovascular Symptoms: 0 - No symptoms/signs 1 - Tachycardia (100-125 BPM), palpitations, generalized weakness, benign dysrhythmia, or hypotension 2 - Tachycardia (126-175 BPM) or hypotension, with SBP > 100 mmHg 3 - Extreme Tachycarida (>175 BPM), hypotension with SBP < 100 mmHg, malignant dysrhythmia, or cardiac arrest  Local Wound: 0 - No symptoms/signs 1 - Pain, swelling, or ecchymosis within 5-7.5cm of bite site 2 - Pain, swelling, or ecchymosis involving less than half the extremity (7.5-50cm from bite site) 3 - Pain, swelling, or ecchymosis involving half to all of the extremity (50-100cm from bite site) 4 - Pain, swelling, or ecchymosis extending beyond affected extremity (more than 100cm from the bite  site) Gastrointestinal System: 0 - No symptoms/signs 1 - Pain, tenesmus or nausea 2 - Vomiting or diarrhea 3 - Repeated vomiting, diarrhea, hematemesis, or hematochezia  Hematologic Symptoms: 0 - No symptoms/signs 1 - Coagulation parameters slightly abnormal: PT <20 sec, PTT <50 sec, PLT 100-150K/ml, or fibrinogen 100-150 mcg/ml 2 - Coagulation parameters abnormal: PT <20-25 sec, PTT <50-75 sec, PLT 50-100K/ml, or fibrinogen 50-100 mcg/ml 3 - Coagulation parameters abnormal: PT <50-100 sec, PTT <75-100 sec, PLT 20-50K/ml, or fibrinogen 50 mcg/ml 4 - Coagulation parameters markedly abnormal with serious bleeding or the threat of spontaneous bleeding: unmeasurable PT or PTT, PLT <20K/ml, or undetectable fibrinogen; severe abnomalities of other laboratory values also fall into this category  Central Nervous System: 0 - No symptoms/signs 1 - Minimal apprehension, headache, weakness, dizziness, chills, or paresthesia 2 - Moderate apprehension, headache, weakness, dizziness, chills, paresthesia, confusion, or fasciculation in area of bite site 3 - Severe confusion, lethargy, seizures, coma, psychosis, or generalized fasciculation  Score:        Severity of Envenomation  Snakebite Severity Score 0-3 (Minimal) 4-7 (Moderate) 8-20 (Severe)  CroFab Indicated? No Yes Yes; may require multiple doses    Final Clinical Impressions(s) / ED Diagnoses   Final diagnoses:  None    ED Discharge Orders    None       Jeanie Sewer, PA-C 08/06/18 1913    Nanavati, [place],  MD 08/08/18 2123

## 2018-08-06 NOTE — Consult Note (Signed)
ORTHOPAEDIC CONSULTATION  REQUESTING PHYSICIAN: Lenore Cordia, MD  Chief Complaint: snake bite to the R IF  HPI: [name] is a 58 y.o. male who complains of  He was bit by a copperhead snake on 5/16 Around 11am. Pain was controlled but swelling increased in hand and arm so he returned to the ED  Past Medical History:  Diagnosis Date  . Asthma   . Atrial flutter [facility]: [facility] Rhonda W)    Past Surgical History:  Procedure Laterality Date  . ELECTROPHYSIOLOGIC STUDY     atrial flutter  . hernia (other)  1980s  . lumbar back surgery  1999   Social History   Socioeconomic History  . Marital status: Married    Spouse name: Not on file  . Number of children: Not on file  . Years of education: Not on file  . Highest education level: Not on file  Occupational History  . Not on file  Social Needs  . Financial resource strain: Not on file  . Food insecurity:    Worry: Not on file    Inability: Not on file  . Transportation needs:    Medical: Not on file    Non-medical: Not on file  Tobacco Use  . Smoking status: Never Smoker  . Smokeless tobacco: Never Used  Substance and Sexual Activity  . Alcohol use: No  . Drug use: No  . Sexual activity: Not on file  Lifestyle  . Physical activity:    Days per week: Not on file    Minutes per session: Not on file  . Stress: Not on file  Relationships  . Social connections:    Talks on phone: Not on file    Gets together: Not on file    Attends religious service: Not on file    Active member of club or organization: Not on file    Attends meetings of clubs or organizations: Not on file    Relationship status: Not on file  Other Topics Concern  . Not on file  Social History Narrative   Married. 4 children. Works as a Marine scientist.    Family History  Problem Relation Age of Onset  . Diabetes Mother   . Hyperlipidemia Mother   . Emphysema Father   . Glaucoma Father        open-angle  . Asthma Sister    No Known Allergies Prior  to Admission medications   Medication Sig Start Date End Date Taking? Authorizing Provider  aspirin (ASPIR-LOW) 81 MG EC tablet Take 81 mg by mouth daily.     Yes [provider]  Cholecalciferol (VITAMIN D) 2000 units CAPS Take 4,000 Units by mouth daily.   Yes [provider]  KRILL OIL PO Take by mouth.   Yes [provider]  Multiple Vitamins-Minerals (DAILY MULTI) TABS Take by mouth daily.     Yes [provider]  oxyCODONE-acetaminophen (PERCOCET) 5-325 MG tablet Take 1 tablet by mouth every 4 (four) hours as needed. 08/05/18  Yes Veronese, [place], MD  Turmeric 500 MG CAPS Take 1,000 mg by mouth daily.   Yes [provider]   Dg Hand 2 View Right  Result Date: 08/05/2018 CLINICAL DATA:  Snake bite EXAM: RIGHT HAND - 2 VIEW COMPARISON:  None. FINDINGS: Calcifications adjacent to the scaphoid are identified. No overt overlying soft tissue swelling identified. No abnormalities in the region of the sick bite in the index finger. No other acute abnormalities. IMPRESSION: 1. The index  finger is normal in appearance. 2. The calcifications adjacent to the scaphoid are likely remote given history. Recommend clinical correlation to exclude acute pain in the snuffbox. Electronically Signed   By: Dorise Bullion III M.D   On: 08/05/2018 13:11    Positive ROS: All other systems have been reviewed and were otherwise negative with the exception of those mentioned in the HPI and as above.  Labs cbc Recent Labs    08/06/18 0450 08/06/18 1604  WBC 6.3 6.7  HGB 14.0 14.1  HCT 42.1 43.6  PLT 193 209    Labs inflam No results for input(s): CRP in the last 72 hours.  Invalid input(s): ESR  Labs coag Recent Labs    08/06/18 1604 08/06/18 2002  INR 1.2 1.3*    Recent Labs    08/06/18 1604 08/06/18 2002  NA 139 136  K 4.3 4.0  CL 105 105  CO2 26 21*  GLUCOSE 88 116*  BUN 16 14  CREATININE 1.22 1.13  CALCIUM 9.0 8.6*    Physical Exam:  Vitals:   08/06/18 1945 08/06/18 2031  BP: 136/83 133/86  Pulse:  70  Resp: 17   Temp:  98.4 F (36.9 C)  SpO2:  98%   General: Alert, no acute distress Cardiovascular: No pedal edema Respiratory: No cyanosis, no use of accessory musculature GI: No organomegaly, abdomen is soft and non-tender Skin: No lesions in the area of chief complaint other than those listed below in MSK exam.  Neurologic: Sensation intact distally save for the below mentioned MSK exam Psychiatric: Patient is competent for consent with normal mood and affect Lymphatic: No axillary or cervical lymphadenopathy  MUSCULOSKELETAL:  RUE: compartments soft, NVI to fingers. Swelling limits ROM but he has painles 50% arc of motion to MP and IP and Wrist joints.  Other extremities are atraumatic with painless ROM and NVI.  Assessment: R arm snake bite  Plan: I will follow and assess for any need for surgical intervention. I do not see an indication at this time. I advised patient to notify me if pain increases. Will recheck in AM   Renette Butters, MD Cell [phone]   08/06/2018 8:39 PM

## 2018-08-06 NOTE — Discharge Instructions (Signed)
As we discussed, your work-up today was reassuring, and we think that your episode earlier was what is called a vasovagal episode.  Your lab work was repeated twice and you were observed overnight.  The swelling seems to be stable if not improving somewhat in your hand and arm.  Please remember to keep it elevated as much as possible and use the medications you were prescribed previously for pain control.  You can call the office of Dr. Mathis Bud to schedule a follow-up appointment.  She has a hand specialist in the either she or 1 of her colleagues a be able to see you either in person or with a telemedicine visit within a couple of days.  Return to the emergency department if you develop new or worsening symptoms that concern you.

## 2018-08-06 NOTE — ED Notes (Signed)
Pt assisted with elevating arm in a more comfortable fashion; says it's the most comfortable he's been since arrival; given remote for TV:

## 2018-08-06 NOTE — H&P (Signed)
History and Physical    [name] WGN:[id number] DOB: 09/20/1960 DOA: 08/06/2018  PCP: [name], [name], PA  Patient coming from: Home  I have personally briefly reviewed [facility]  Chief Complaint: Swelling of right arm after snake bite  HPI: [name] is a 58 y.o. male with medical history significant for remote atrial flutter status post ablation not on anticoagulation and asthma who presents to the ED with worsening right upper extremity swelling after snakebite.  Patient states he was gardening around 11 AM on 08/05/2018 when he was bit by a copperhead snake (which she killed afterwards) with a single puncture wound to his right index finger at the lateral PIP joint.  He developed some localized swelling and pain at the finger and went to [name] for further evaluation.  He was monitored over several hours and was determined not to meet criteria for CroFab administration.  He was given a Tdap shot and discharged home.  At home he had an episode of lightheadedness and diaphoresis felt as if he was going to pass out.  He did not lose consciousness and symptoms resolved quickly.  He thinks this may have been due to not eating all day.  [name].  Labs were notable for CK 533 with repeat of 432.  It was felt that he did not meet criteria for CroFab and patient also declined CroFab at that time.  Per EDP documentation he had better mobility of his hand and he was discharged home.  Patient then developed overall worsening pain and swelling at the right hand which was progressing up to his right elbow.  At that time it was recommended to give CroFab, however [name]  He denies any associated fevers, chest pain, dyspnea, nausea, vomiting, abdominal pain, diarrhea, constipation, dysuria, obvious bleeding, history of blood  clots.  He is left-hand dominant.  ED Course:  Initial vitals showed BP 145/88, pulse 109, RR 18, temp 98.9 Fahrenheit, SPO2 97% on room air.  Labs are notable for CK 453, fibrinogen 310, PT 15.3, INR 1.2, BUN 16, creatinine 1.22, WBC 6.7, hemoglobin 14.1, platelets 209,000.  Patient was given 4 units of IV CroFab the hospitalist service was consulted for further evaluation and management.  Hand surgery have also been consulted.  Review of Systems: All systems reviewed and are negative except as documented in history of present illness above.   Past Medical History:  Diagnosis Date   Asthma    Atrial flutter (HCC)     Past Surgical History:  Procedure Laterality Date   ELECTROPHYSIOLOGIC STUDY     atrial flutter   hernia (other)  1980s   lumbar back surgery  1999    Social History:  reports that he has never smoked. He has never used smokeless tobacco. He reports that he does not drink alcohol or use drugs.  No Known Allergies  Family History  Problem Relation Age of Onset   Diabetes Mother    Hyperlipidemia Mother    Emphysema Father    Glaucoma Father        open-angle   Asthma Sister      Prior to Admission medications   Medication Sig Start Date End Date Taking? Authorizing Provider  aspirin (ASPIR-LOW) 81 MG EC tablet Take 81 mg by mouth daily.     Yes [provider]  Cholecalciferol (VITAMIN D) 2000 units CAPS Take 4,000 Units by mouth daily.   Yes [provider]  KRILL OIL PO Take by mouth.   Yes [provider]  Multiple Vitamins-Minerals (DAILY MULTI) TABS Take by mouth daily.     Yes [provider]  oxyCODONE-acetaminophen (PERCOCET) 5-325 MG tablet Take 1 tablet by mouth every 4 (four) hours as needed. 08/05/18  Yes Veronese, [place], MD  Turmeric 500 MG CAPS Take 1,000 mg by mouth daily.   Yes [provider]    Physical Exam: Vitals:   08/06/18 1845 08/06/18 1900 08/06/18 1930 08/06/18 1945    BP: 131/85 138/84 138/85 136/83  Pulse: 63     Resp: Temp:      TempSrc:      SpO2: 93% 96% 96%     Constitutional: Sitting up in bed, NAD, calm, comfortable Eyes: PERRL, lids and conjunctivae normal ENMT: Mucous membranes are moist. Posterior pharynx clear of any exudate or lesions.Normal dentition.  Neck: normal, supple, no masses. Respiratory: Inspiratory bibasilar crackles. Normal respiratory effort. No accessory muscle use.  Cardiovascular: Regular rate and rhythm, no murmurs / rubs / gallops. No extremity edema. 2+ radial and pedal pulses. Abdomen: no tenderness, no masses palpated. No hepatosplenomegaly. Bowel sounds positive.  Musculoskeletal: Significant swelling/edema of right upper extremity from hand to the elbow.  Range of motion with flexion, abduction, abduction of her fingers decreased on the right. Skin: Punctate area of erythema at the lateral aspect of the right PIP joint of the second finger without open wound or drainage. Neurologic: CN 2-12 grossly intact. Sensation intact, DTR normal. Strength 5/5 in all 4 extremities except for decreased strength of right hand and all fingers of the right hand.  Psychiatric: Normal judgment and insight. Alert and oriented x 3. Normal mood.    Labs on Admission: I have personally reviewed following labs and imaging studies  CBC: Recent Labs  Lab 08/05/18 1206 08/05/18 1753 08/06/18 0019 08/06/18 0450 08/06/18 1604  WBC 4.7 7.2 7.1 6.3 6.7  NEUTROABS 1.7 5.0 4.5 4.4 5.2  HGB 15.4 15.2 15.6 14.0 14.1  HCT 45.8 45.2 45.8 42.1 43.6  MCV 93.7 94.0 93.5 95.2 96.9  PLT 213 220 208 193 209   Basic Metabolic Panel: Recent Labs  Lab 08/05/18 1206 08/06/18 0019 08/06/18 1604  NA 138 140 139  K 3.5 3.4* 4.3  CL 104 106 105  CO2 GLUCOSE 111* 145* 88  BUN CREATININE 1.06 0.87 1.22  CALCIUM 9.0 8.8* 9.0   GFR: Estimated Creatinine Clearance: 66.5 mL/min (by C-G formula based on SCr of  1.22 mg/dL). Liver Function Tests: Recent Labs  Lab 08/06/18 1604  AST 37  ALT 32  ALKPHOS 57  BILITOT 0.8  PROT 6.0*  ALBUMIN 3.6   No results for input(s): LIPASE, AMYLASE in the last 168 hours. No results for input(s): AMMONIA in the last 168 hours. Coagulation Profile: Recent Labs  Lab 08/05/18 1206 08/05/18 1753 08/06/18 0019 08/06/18 1604  INR 1.1 1.1 1.1 1.2   Cardiac Enzymes: Recent Labs  Lab 08/06/18 0019 08/06/18 0450 08/06/18 1604  CKTOTAL 533* 432* 453*  TROPONINI <0.03 <0.03  --    BNP (last 3 results) No results for input(s): PROBNP in the last 8760 hours. HbA1C: No results for input(s): HGBA1C in the last 72 hours. CBG: No results for input(s): GLUCAP in the last 168 hours. Lipid Profile:  No results for input(s): CHOL, HDL, LDLCALC, TRIG, CHOLHDL, LDLDIRECT in the last 72 hours. Thyroid Function Tests: No results for input(s): TSH, T4TOTAL, FREET4, T3FREE, THYROIDAB in the last 72 hours. Anemia Panel: No results for input(s): VITAMINB12, FOLATE, FERRITIN, TIBC, IRON, RETICCTPCT in the last 72 hours. Urine analysis: No results found for: COLORURINE, APPEARANCEUR, LABSPEC, PHURINE, GLUCOSEU, HGBUR, BILIRUBINUR, KETONESUR, PROTEINUR, UROBILINOGEN, NITRITE, LEUKOCYTESUR  Radiological Exams on Admission: Dg Hand 2 View Right  Result Date: 08/05/2018 CLINICAL DATA:  Snake bite EXAM: RIGHT HAND - 2 VIEW COMPARISON:  None. FINDINGS: Calcifications adjacent to the scaphoid are identified. No overt overlying soft tissue swelling identified. No abnormalities in the region of the sick bite in the index finger. No other acute abnormalities. IMPRESSION: 1. The index finger is normal in appearance. 2. The calcifications adjacent to the scaphoid are likely remote given history. Recommend clinical correlation to exclude acute pain in the snuffbox. Electronically Signed   By: Gerome Sam III M.D   On: 08/05/2018 13:11    EKG: Independently reviewed. Sinus rhythm,  first-degree AV block, no acute ischemic changes.  First-degree AV block seen on prior.  Assessment/Plan Principal Problem:   Snake bite  Kelvon R Alway is a 58 y.o. male with medical history significant for remote atrial flutter status post ablation not on anticoagulation and asthma who is admitted with swelling of the right upper extremity secondary to a snakebite to the right index finger.   Snakebite of right second finger with associated swelling: Reported copperhead snake bite occurring around 11:30 AM 08/05/2018.  Has had progressive swelling of his right hand up to his elbow and decreased range of motion of the right hand.  He was given 4 units IV CroFab in the ED.  Hand surgery consulted, recommend IV cefazolin and have ordered arm pillow for hand elevation.  Pain is currently controlled, patient is advised to notify nurse for any worsening pain to contact orthopedics.  Right radial pulse intact. -Will give additional 2 units IV CroFab due to continued swelling -Continue arm pillow -IV cefazolin started per Ortho -Recheck CMP, CK, PT/INR, PTT now and again in the morning -Continue gentle IV fluids -Orthopedic consultation appreciated, will follow up in a.m.  History of atrial flutter s/p ablation in 2008: Remains in sinus rhythm.  He is not on anticoagulation, takes aspirin 81 mg daily. -Continue aspirin  Asthma: Chronic and stable, not requiring maintenance medications or regular use of as needed albuterol as an outpatient. -Albuterol as needed   DVT prophylaxis: Subcutaneous heparin Code Status: Full code, confirmed with patient Family Communication: None present on admission Disposition Plan: Pending clinical progress Consults called: Hand surgery Admission status: Observation   Darreld Mclean MD [facility]  If 7PM-7AM, please contact night-coverage [email or web address]  08/06/2018, 8:13 PM

## 2018-08-06 NOTE — ED Notes (Signed)
Pt found in room att; ED Provider at bedside.  Pt reports hx of atrial flutter and ablation approx 8 years prior, daily ASA, no longer seeing a cardiologist  Pt denies regular hx of syncope;   Fluids infusing after pt was working outside today and was bitten on the right hand by snake, insertion (fang) point at right lateral aspect of index finger at second joint  Hand and arm marked from previous measurments

## 2018-08-07 DIAGNOSIS — W5911XA Bitten by nonvenomous snake, initial encounter: Secondary | ICD-10-CM | POA: Diagnosis not present

## 2018-08-07 DIAGNOSIS — J45909 Unspecified asthma, uncomplicated: Secondary | ICD-10-CM | POA: Diagnosis not present

## 2018-08-07 LAB — CBC
HCT: 43.1 % (ref 39.0–52.0)
Hemoglobin: 14.2 g/dL (ref 13.0–17.0)
MCH: 31.3 pg (ref 26.0–34.0)
MCHC: 32.9 g/dL (ref 30.0–36.0)
MCV: 95.1 fL (ref 80.0–100.0)
Platelets: 204 10*3/uL (ref 150–400)
RBC: 4.53 MIL/uL (ref 4.22–5.81)
RDW: 11.9 % (ref 11.5–15.5)
WBC: 5.9 10*3/uL (ref 4.0–10.5)
nRBC: 0 % (ref 0.0–0.2)

## 2018-08-07 LAB — COMPREHENSIVE METABOLIC PANEL
ALT: 27 U/L (ref 0–44)
AST: 30 U/L (ref 15–41)
Albumin: 3.4 g/dL — ABNORMAL LOW (ref 3.5–5.0)
Alkaline Phosphatase: 46 U/L (ref 38–126)
Anion gap: 8 (ref 5–15)
BUN: 8 mg/dL (ref 6–20)
CO2: 25 mmol/L (ref 22–32)
Calcium: 8.5 mg/dL — ABNORMAL LOW (ref 8.9–10.3)
Chloride: 105 mmol/L (ref 98–111)
Creatinine, Ser: 0.95 mg/dL (ref 0.61–1.24)
GFR calc Af Amer: >60 mL/min (ref >60)
GFR calc non Af Amer: >60 mL/min (ref >60)
Glucose, Bld: 127 mg/dL — ABNORMAL HIGH (ref 70–99)
Potassium: 3.5 mmol/L (ref 3.5–5.1)
Sodium: 138 mmol/L (ref 135–145)
Total Bilirubin: 1.1 mg/dL (ref 0.3–1.2)
Total Protein: 6.4 g/dL — ABNORMAL LOW (ref 6.5–8.1)

## 2018-08-07 LAB — PROTIME-INR
INR: 1.2 (ref 0.8–1.2)
Prothrombin Time: 15.4 seconds — ABNORMAL HIGH (ref 11.4–15.2)

## 2018-08-07 LAB — HIV ANTIBODY (ROUTINE TESTING W REFLEX): HIV Screen 4th Generation wRfx: NONREACTIVE

## 2018-08-07 LAB — CK: Total CK: 376 U/L (ref 49–397)

## 2018-08-07 MED ORDER — SODIUM CHLORIDE 0.9 % IV SOLN
2.0000 | Freq: Once | INTRAVENOUS | Status: DC
  Filled 2018-08-07: qty 36

## 2018-08-07 NOTE — Progress Notes (Signed)
Pt's right arm swelling stable, no changes at this time. Pt denies pain.

## 2018-08-07 NOTE — Discharge Summary (Signed)
Physician Discharge Summary  [name]:[id number] DOB: 04/29/1960 DOA: 08/06/2018  PCP: [name], PA  Admit date: 08/06/2018 Discharge date: 08/07/2018  Admitted From: Home Disposition: Home  Recommendations for Outpatient Follow-up:  1. Follow up with PCP in 1 week 2. Please obtain BMP/CBC in one week 3. Follow up with poison control 4. Please follow up on the following pending results: None  Home Health: None Equipment/Devices: None  Discharge Condition: Stable CODE STATUS: Full code Diet recommendation: Regular diet   Brief/Interim Summary:  Admission HPI written by [name], MD   Chief Complaint: Swelling of right arm after snake bite  HPI: [name] is a 58 y.o. male with medical history significant for remote atrial flutter status post ablation not on anticoagulation and asthma who presents to the ED with worsening right upper extremity swelling after snakebite.  Patient states he was gardening around 11 AM on 08/05/2018 when he was bit by a copperhead snake (which she killed afterwards) with a single puncture wound to his right index finger at the lateral PIP joint.  He developed some localized swelling and pain at the finger and went to [facility] ED for further evaluation.  He was monitored over several hours and was determined not to meet criteria for CroFab administration.  He was given a Tdap shot and discharged home.  At home he had an episode of lightheadedness and diaphoresis felt as if he was going to pass out.  He did not lose consciousness and symptoms resolved quickly.  He thinks this may have been due to not eating all day.  He returned to the [facility] ED for evaluation.  Labs were notable for CK 533 with repeat of 432.  It was felt that he did not meet criteria for CroFab and patient also declined CroFab at that time.  Per EDP documentation he had better mobility of his hand and he was discharged home.  Patient then developed overall  worsening pain and swelling at the right hand which was progressing up to his right elbow.  At that time it was recommended to give CroFab, however patient requested second opinion at [facility]. [facility] his wife to bring him to the [facility] ED.  He denies any associated fevers, chest pain, dyspnea, nausea, vomiting, abdominal pain, diarrhea, constipation, dysuria, obvious bleeding, history of blood clots.  He is left-hand dominant.   Hospital course:  Snake bite Associated right arm swelling. Poison control contacted. Orthopedic surgery consulted. Patient given 6 units of IV CroFab. CK, INR, PT mildly elevated, but trended down prior to discharge. Started empirically on Ancef which was discontinued prior to discharge. Arm swelling improved overnight. Orthopedic surgery signed off. Outpatient follow-up if symptoms worsen vs return to ED. Poison control to contact patient as an outpatient.  History of atrial fibrillation Patient is s/p ablation. On aspirin.  Asthma Albuterol as needed.  Discharge Diagnoses:  Principal Problem:   Snake bite    Discharge Instructions  Discharge Instructions    Call MD for:  severe uncontrolled pain   Complete by:  As directed    Call MD for:  temperature >100.4   Complete by:  As directed      Allergies as of 08/07/2018   No Known Allergies     Medication List    TAKE these medications   Aspir-Low 81 MG EC tablet Generic drug:  aspirin Take 81 mg by mouth daily.   Daily Multi Tabs Take by mouth daily.  KRILL OIL PO Take by mouth.   oxyCODONE-acetaminophen 5-325 MG tablet Commonly known as:  Percocet Take 1 tablet by mouth every 4 (four) hours as needed.   Turmeric 500 MG Caps Take 1,000 mg by mouth daily.   Vitamin D 50 MCG (2000 UT) Caps Take 4,000 Units by mouth daily.      Follow-up Information    Anola GurneyChauvin, [name], [place]. Schedule an appointment as soon as possible for a visit in 1 week(s).   Specialty:  Family  Medicine Why:  Snake bite Contact information: [address] [place] [address] [phone]        Sheral ApleyMurphy, Timothy D, MD. Schedule an appointment as soon as possible for a visit.   Specialty:  Orthopedic Surgery Why:  As needed if hand gets worse Contact information: [address] [place] [address] [phone]          No Known Allergies  Consultations:  Orthopedic surgery   Procedures/Studies: Dg Hand 2 View Right  Result Date: 08/05/2018 CLINICAL DATA:  Snake bite EXAM: RIGHT HAND - 2 VIEW COMPARISON:  None. FINDINGS: Calcifications adjacent to the scaphoid are identified. No overt overlying soft tissue swelling identified. No abnormalities in the region of the sick bite in the index finger. No other acute abnormalities. IMPRESSION: 1. The index finger is normal in appearance. 2. The calcifications adjacent to the scaphoid are likely remote given history. Recommend clinical correlation to exclude acute pain in the snuffbox. Electronically Signed   By: Gerome Samavid  Williams III M.D   On: 08/05/2018 13:11      Subjective: Swelling improved. Worsens when arm is not raised.  Discharge Exam: Vitals:   08/07/18 0436 08/07/18 1158  BP: 133/87 125/76  Pulse: 64 64  Resp:    Temp: 98.3 F (36.8 C) 98.3 F (36.8 C)  SpO2: 96% 99%   Vitals:   08/06/18 1945 08/06/18 2031 08/07/18 0436 08/07/18 1158  BP: 136/83 133/86 133/87 125/76  Pulse:  70 64 64  Resp: 17     Temp:  98.4 F (36.9 C) 98.3 F (36.8 C) 98.3 F (36.8 C)  TempSrc:  Oral Oral Oral  SpO2:  98% 96% 99%    General: Pt is alert, awake, not in acute distress Cardiovascular: RRR, S1/S2 +, no rubs, no gallops Respiratory: CTA bilaterally, no wheezing, no rhonchi Abdominal: Soft, NT, ND, bowel sounds + Extremities: Right had and forearm with edema. Mildly tender. No significant erythema or purulent drainage    The results of significant diagnostics from this  hospitalization (including imaging, microbiology, ancillary and laboratory) are listed below for reference.     Microbiology: Recent Results (from the past 240 hour(s))  SARS Coronavirus 2 (CEPHEID - [facility]), Hosp Order     Status: None   Collection Time: 08/06/18  7:36 PM  Result Value Ref Range Status   SARS Coronavirus 2 NEGATIVE NEGATIVE Final    Comment: (NOTE) If result is NEGATIVE SARS-CoV-2 target nucleic acids are NOT DETECTED. The SARS-CoV-2 RNA is generally detectable in upper and lower  respiratory specimens during the acute phase of infection. The lowest  concentration of SARS-CoV-2 viral copies this assay can detect is 250  copies / mL. A negative result does not preclude SARS-CoV-2 infection  and should not be used as the sole basis for treatment or other  patient management decisions.  A negative result may occur with  improper specimen collection / handling, submission of specimen  other  than nasopharyngeal swab, presence of viral mutation(s) within the  areas targeted by this assay, and inadequate number of viral copies  (<250 copies / mL). A negative result must be combined with clinical  observations, patient history, and epidemiological information. If result is POSITIVE SARS-CoV-2 target nucleic acids are DETECTED. The SARS-CoV-2 RNA is generally detectable in upper and lower  respiratory specimens dur ing the acute phase of infection.  Positive  results are indicative of active infection with SARS-CoV-2.  Clinical  correlation with patient history and other diagnostic information is  necessary to determine patient infection status.  Positive results do  not rule out bacterial infection or co-infection with other viruses. If result is PRESUMPTIVE POSTIVE SARS-CoV-2 nucleic acids MAY BE PRESENT.   A presumptive positive result was obtained on the submitted specimen  and confirmed on repeat testing.  While 2019 novel coronavirus   (SARS-CoV-2) nucleic acids may be present in the submitted sample  additional confirmatory testing may be necessary for epidemiological  and / or clinical management purposes  to differentiate between  SARS-CoV-2 and other Sarbecovirus currently known to infect humans.  If clinically indicated additional testing with an alternate test  methodology [phone]) is advised. The SARS-CoV-2 RNA is generally  detectable in upper and lower respiratory sp ecimens during the acute  phase of infection. The expected result is Negative. Fact Sheet for Patients:  BoilerBrush.com.cy Fact Sheet for Healthcare Providers: https://pope.com/ This test is not yet approved or cleared by the [place] FDA and has been authorized for detection and/or diagnosis of SARS-CoV-2 by FDA under an Emergency Use Authorization (EUA).  This EUA will remain in effect (meaning this test can be used) for the duration of the COVID-19 declaration under Section 564(b)(1) of the Act, 21 U.S.C. section 360bbb-3(b)(1), unless the authorization is terminated or revoked sooner. Performed at [facility], [address] [place], [place] [address]      Labs: BNP (last 3 results) No results for input(s): BNP in the last 8760 hours. Basic Metabolic Panel: Recent Labs  Lab 08/05/18 1206 08/06/18 0019 08/06/18 1604 08/06/18 2002 08/07/18 0654  NA 138 140 139 136 138  K 3.5 3.4* 4.3 4.0 3.5  CL 104 106 105 105 105  CO2 25 26 26  21* 25  GLUCOSE 111* 145* 88 116* 127*  BUN 18 14 16 14 8   CREATININE 1.06 0.87 1.22 1.13 0.95  CALCIUM 9.0 8.8* 9.0 8.6* 8.5*   Liver Function Tests: Recent Labs  Lab 08/06/18 1604 08/06/18 2002 08/07/18 0654  AST 37 31 30  ALT 32 29 27  ALKPHOS 57 48 46  BILITOT 0.8 0.6 1.1  PROT 6.0* 6.2* 6.4*  ALBUMIN 3.6 3.4* 3.4*   No results for input(s): LIPASE, AMYLASE in the last 168 hours. No results for input(s): AMMONIA in the last  168 hours. CBC: Recent Labs  Lab 08/05/18 1206 08/05/18 1753 08/06/18 0019 08/06/18 0450 08/06/18 1604 08/07/18 0654  WBC 4.7 7.2 7.1 6.3 6.7 5.9  NEUTROABS 1.7 5.0 4.5 4.4 5.2  --   HGB 15.4 15.2 15.6 14.0 14.1 14.2  HCT 45.8 45.2 45.8 42.1 43.6 43.1  MCV 93.7 94.0 93.5 95.2 96.9 95.1  PLT 213 220 208 193 209 204   Cardiac Enzymes: Recent Labs  Lab 08/06/18 0019 08/06/18 0450 08/06/18 1604 08/06/18 2002 08/07/18 0654  CKTOTAL 533* 432* 453* 401* 376  TROPONINI <0.03 <0.03  --   --   --    BNP: Invalid  input(s): POCBNP CBG: No results for input(s): GLUCAP in the last 168 hours. D-Dimer No results for input(s): DDIMER in the last 72 hours. Hgb A1c No results for input(s): HGBA1C in the last 72 hours. Lipid Profile No results for input(s): CHOL, HDL, LDLCALC, TRIG, CHOLHDL, LDLDIRECT in the last 72 hours. Thyroid function studies No results for input(s): TSH, T4TOTAL, T3FREE, THYROIDAB in the last 72 hours.  Invalid input(s): FREET3 Anemia work up No results for input(s): VITAMINB12, FOLATE, FERRITIN, TIBC, IRON, RETICCTPCT in the last 72 hours. Urinalysis No results found for: COLORURINE, APPEARANCEUR, LABSPEC, PHURINE, GLUCOSEU, HGBUR, BILIRUBINUR, KETONESUR, PROTEINUR, UROBILINOGEN, NITRITE, LEUKOCYTESUR Sepsis Labs Invalid input(s): PROCALCITONIN,  WBC,  LACTICIDVEN Microbiology Recent Results (from the past 240 hour(s))  SARS Coronavirus 2 (CEPHEID - Performed in [facility] hospital lab), Hosp Order     Status: None   Collection Time: 08/06/18  7:36 PM  Result Value Ref Range Status   SARS Coronavirus 2 NEGATIVE NEGATIVE Final    Comment: (NOTE) If result is NEGATIVE SARS-CoV-2 target nucleic acids are NOT DETECTED. The SARS-CoV-2 RNA is generally detectable in upper and lower  respiratory specimens during the acute phase of infection. The lowest  concentration of SARS-CoV-2 viral copies this assay can detect is 250  copies / mL. A negative result does  not preclude SARS-CoV-2 infection  and should not be used as the sole basis for treatment or other  patient management decisions.  A negative result may occur with  improper specimen collection / handling, submission of specimen other  than nasopharyngeal swab, presence of viral mutation(s) within the  areas targeted by this assay, and inadequate number of viral copies  (<250 copies / mL). A negative result must be combined with clinical  observations, patient history, and epidemiological information. If result is POSITIVE SARS-CoV-2 target nucleic acids are DETECTED. The SARS-CoV-2 RNA is generally detectable in upper and lower  respiratory specimens dur ing the acute phase of infection.  Positive  results are indicative of active infection with SARS-CoV-2.  Clinical  correlation with patient history and other diagnostic information is  necessary to determine patient infection status.  Positive results do  not rule out bacterial infection or co-infection with other viruses. If result is PRESUMPTIVE POSTIVE SARS-CoV-2 nucleic acids MAY BE PRESENT.   A presumptive positive result was obtained on the submitted specimen  and confirmed on repeat testing.  While 2019 novel coronavirus  (SARS-CoV-2) nucleic acids may be present in the submitted sample  additional confirmatory testing may be necessary for epidemiological  and / or clinical management purposes  to differentiate between  SARS-CoV-2 and other Sarbecovirus currently known to infect humans.  If clinically indicated additional testing with an alternate test  methodology [phone]) is advised. The SARS-CoV-2 RNA is generally  detectable in upper and lower respiratory sp ecimens during the acute  phase of infection. The expected result is Negative. Fact Sheet for Patients:  BoilerBrush.com.cy Fact Sheet for Healthcare Providers: https://pope.com/ This test is not yet approved or  cleared by the [place] FDA and has been authorized for detection and/or diagnosis of SARS-CoV-2 by FDA under an Emergency Use Authorization (EUA).  This EUA will remain in effect (meaning this test can be used) for the duration of the COVID-19 declaration under Section 564(b)(1) of the Act, 21 U.S.C. section 360bbb-3(b)(1), unless the authorization is terminated or revoked sooner. Performed at [facility], [address], [place], [place] [address]     SIGNED:   Rayna Sexton  Caleb Popp, MD [facility] 08/07/2018, 2:27 PM

## 2018-08-07 NOTE — Progress Notes (Addendum)
Pt stated, arm swelling is a little bit better, but still can not make a big fist. Poison control personnel [facility]) called for update on pt, recommended 2 more vials of anti venom. Notified MD on call. New orders received.

## 2018-08-07 NOTE — Progress Notes (Signed)
Patient ID: [name], male   DOB: 1960-09-03, 58 y.o.   MRN: [id number]   LOS: 0 days   Subjective: Doing better. Swelling has improved, sensation normal.   Objective: Vital signs in last 24 hours: Temp:  [98.3 F (36.8 C)-98.9 F (37.2 C)] 98.3 F (36.8 C) (05/18 1158) Pulse Rate:  [63-109] 64 (05/18 1158) Resp:  [17-23] 17 (05/17 1945) BP: (125-150)/(76-99) 125/76 (05/18 1158) SpO2:  [93 %-99 %] 99 % (05/18 1158) Weight:  [86 kg] 86 kg (05/17 1352) Last BM Date: 08/06/18   Laboratory  CBC Recent Labs    08/06/18 1604 08/07/18 0654  WBC 6.7 5.9  HGB 14.1 14.2  HCT 43.6 43.1  PLT 209 204   BMET Recent Labs    08/06/18 2002 08/07/18 0654  NA 136 138  K 4.0 3.5  CL 105 105  CO2 21* 25  GLUCOSE 116* 127*  BUN 14 8  CREATININE 1.13 0.95  CALCIUM 8.6* 8.5*     Physical Exam General appearance: alert and no distress  Right hand: Edema markedly improved, able to form loose fist, erythematous region on proximal ventral FA unchanged   Assessment/Plan: Snakebite -- Continue current management    Freeman Caldron, PA-C Orthopedic Surgery [phone] 08/07/2018

## 2018-08-07 NOTE — Discharge Instructions (Addendum)
[name],  You were in the hospital because of a snake bite. You will be followed up by Poison Control. Please keep your arm elevated. No antibiotics are needed on discharge.   Snake Bite Snake bite is an injury to the skin or the deeper tissues beneath the skin that is caused by a snake. There are two types of snakes: poisonous (venomous) and nonpoisonous (nonvenomous). A nonvenomous snake will cause a wound. A venomous snake will cause a wound and may also inject poison (venom) into the wound. The effects of snake venom vary depending on the type of snake. In some cases, the effects can be extremely serious or even deadly. A bite from a venomous snake is a medical emergency. Treatment may require the use of antivenom medicine. What are the causes? This injury is caused by a venomous snake or a nonvenomous snake. What increases the risk? You are more likely to get a snake bite if:  You walk or hike in outdoor areas.  You do not cover your arms and legs with clothing when hiking.  You provoke or try to pick up a snake. What are the signs or symptoms? Symptoms of a snake bite vary depending on the type of snake, whether the snake is venomous, and the severity of the bite.  Symptoms for both a venomous or nonvenomous snake may include:  Pain, redness, and swelling at the site of the bite.  Skin discoloration at the site of the bite.  A feeling of nervousness. Symptoms of a venomous snake bite may also include:  Increasing pain and swelling.  Severe anxiety or confusion.  Blood blisters or purple spots in the bite area.  Nausea and vomiting.  Numbness or tingling.  Muscle weakness.  Excessive fatigue or drowsiness.  Excessive sweating.  Difficulty breathing.  Blurred vision.  Bruising and bleeding at the site of the bite.  Feeling faint or light-headed. In some cases, symptoms do not develop until a few hours after the bite. How is this diagnosed? This  condition may be diagnosed based on symptoms and a physical exam. Your health care provider will examine the bite area and ask for details about the snake to help determine whether it is venomous. You may also have tests, including blood tests. How is this treated? Treatment depends on the severity of the bite and whether the snake is venomous.  Treatment for nonvenomous snake bites may include basic wound care. This includes cleaning the wound and applying a bandage (dressing).  Treatment for venomous snake bites may include antivenom medicine in addition to wound care. This medicine needs to be given as soon as possible after the bite. Other treatments may be needed to help control symptoms as they develop. You may need to stay in a hospital so your condition can be monitored. Your health care provider may prescribe antibiotic medicine to avoid infection in the wound. You may need a tetanus shot if it has been more than 5 years since you had one. Follow these instructions at home: Wound care   Follow instructions from your health care provider about how to take care of your wound. Make sure you: ? Wash your hands with soap and water before you change your dressing. If soap and water are not available, use hand sanitizer. ? Change your dressing as told by your health care provider.  Keep the wound clean and dry. Wash the wound daily with soap and water or a germ-killing (antiseptic) soap as told by  your health care provider.  Check your wound every day for signs of infection. Watch for: ? Redness, swelling, or pain that is getting worse. ? Warmth. ? Fluid, blood, or pus.  If you develop blistering at the site of the bite, protect the blisters from breaking. Do not attempt to open a blister.  Do not take baths, swim, or use a hot tub until your health care provider approves. Ask your health care provider if you may take showers. You may only be allowed to take sponge  baths. Medicines  Take or apply over-the-counter and prescription medicines only as told by your health care provider.  If you were prescribed an antibiotic medicine, take or apply it as told by your health care provider. Do not stop using the antibiotic even if your condition improves. General instructions  If possible, keep the affected area raised (elevated) above the level of your heart while you are sitting or lying down.  Keep all follow-up visits as told by your health care provider. This is important. Contact a health care provider if you have:  Increased redness, swelling, or pain at the site of your wound.  Fluid, blood, or pus coming from your wound.  A fever. Get help right away if:  You develop blood blisters or purple spots in the bite area.  You have: ? Nausea or vomiting. ? Numbness or tingling. ? Excessive sweating. ? Trouble breathing. ? Trouble seeing.  You feel very confused.  You feel faint or light-headed. Summary  Snake bite is an injury to the skin or the deeper tissues beneath the skin that is caused by a snake.  A nonvenomous snake will cause a wound. A venomous snake will cause a wound and may also inject poison (venom) into the wound.  The effects of snake venom vary depending on the type of snake.  Treatment depends on the severity of the bite and whether the snake is venomous. You may require antivenom or antibiotic medicine after a snake bite. This information is not intended to replace advice given to you by your health care provider. Make sure you discuss any questions you have with your health care provider. Document Released: 03/05/2000 Document Revised: 08/24/2017 Document Reviewed: 08/24/2017 Elsevier Interactive Patient Education  2019 [employer].

## 2018-08-07 NOTE — Progress Notes (Signed)
Pt's right arm maintained elevated throughout the night.

## 2018-08-07 NOTE — Progress Notes (Addendum)
Pt's right arm swelling came down. Pt can make fist better than it was. Pt states he feels a lot better at this time. Notified Poison control personnel Reuel Boom) Stated to hold off additional anti venom at this time. Notified MD on call.

## 2018-08-08 ENCOUNTER — Telehealth: Payer: Self-pay | Admitting: Family Medicine

## 2018-08-08 DIAGNOSIS — T63001A Toxic effect of unspecified snake venom, accidental (unintentional), initial encounter: Secondary | ICD-10-CM

## 2018-08-08 NOTE — Addendum Note (Signed)
Addended by: [name] E on: 08/08/2018 12:15 PM   Modules accepted: Orders

## 2018-08-08 NOTE — Telephone Encounter (Signed)
Pt was bitten by a copperhead on Saturday He went to ER but was admitted.  He was bitten on the hand and needs ortho surgery.  He is scheduled with Dr. Eulah Pont for appt tomorrow am and will need a referral from the primary for this visit.   CB#  [phone]  Thanks Barth Kirks

## 2018-08-16 ENCOUNTER — Encounter: Payer: Self-pay | Admitting: Physician Assistant

## 2018-08-16 ENCOUNTER — Ambulatory Visit (INDEPENDENT_AMBULATORY_CARE_PROVIDER_SITE_OTHER): Payer: No Typology Code available for payment source | Admitting: Physician Assistant

## 2018-08-16 ENCOUNTER — Other Ambulatory Visit: Payer: Self-pay

## 2018-08-16 VITALS — BP 146/75 | HR 73 | Temp 98.5°F | Resp 16 | Wt 195.8 lb

## 2018-08-16 DIAGNOSIS — Z1211 Encounter for screening for malignant neoplasm of colon: Secondary | ICD-10-CM

## 2018-08-16 DIAGNOSIS — W5911XD Bitten by nonvenomous snake, subsequent encounter: Secondary | ICD-10-CM | POA: Diagnosis not present

## 2018-08-16 DIAGNOSIS — R2230 Localized swelling, mass and lump, unspecified upper limb: Secondary | ICD-10-CM

## 2018-08-16 NOTE — Progress Notes (Signed)
Patient: [name] Male    DOB: 02/09/1961   58 y.o.   MRN: [id number] Visit Date: 08/16/2018  Today's Provider: Trey SailorsAdriana M Pollak, PA-C   Chief Complaint  Patient presents with  . Hospitalization Follow-up   Subjective:     HPI  Follow up ER visit  Patient was seen in ER for snake bite to right index finger on 08/06/18. He was treated for copper head snake bite . Treatment for this included IV treatment of CroFab. He reports good compliance with treatment, patient is currently taking otc Tylenol and prescription pain medication He reports this condition is Unchanged. Patient reports that has a follow up appointment with hand surgeon on Dr. Eulah PontMurphy on 09/01/18 to discuss PT. Initially saw him last Tuesday.  Patient works as a Engineer, civil (consulting)nurse at [employer]. He denies fevers, chills, N/V, joint pain.   BP Readings from Last 3 Encounters:  08/16/18 (!) 146/75  08/07/18 129/83  08/06/18 132/84   Colon Cancer Screening: Last colonoscopy 01/2009 was normal and due for repeat in 01/2019.  ------------------------------------------------------------------------------------   No Known Allergies   Current Outpatient Medications:  .  aspirin (ASPIR-LOW) 81 MG EC tablet, Take 81 mg by mouth daily.  , Disp: , Rfl:  .  Cholecalciferol (VITAMIN D) 2000 units CAPS, Take 4,000 Units by mouth daily., Disp: , Rfl:  .  KRILL OIL PO, Take by mouth., Disp: , Rfl:  .  Multiple Vitamins-Minerals (DAILY MULTI) TABS, Take by mouth daily.  , Disp: , Rfl:  .  oxyCODONE-acetaminophen (PERCOCET) 5-325 MG tablet, Take 1 tablet by mouth every 4 (four) hours as needed., Disp: 12 tablet, Rfl: 0 .  Turmeric 500 MG CAPS, Take 1,000 mg by mouth daily., Disp: , Rfl:   Review of Systems  Constitutional: Negative.   Respiratory: Negative.   Cardiovascular: Negative.   Gastrointestinal: Negative.   Musculoskeletal: Positive for arthralgias and joint swelling.  Neurological: Negative.     Social History    Tobacco Use  . Smoking status: Never Smoker  . Smokeless tobacco: Never Used  Substance Use Topics  . Alcohol use: No      Objective:   BP (!) 146/75   Pulse 73   Temp 98.5 F (36.9 C) (Oral)   Resp 16   Wt 195 lb 12.8 oz (88.8 kg)   BMI 28.09 kg/m  Vitals:   08/16/18 1040  BP: (!) 146/75  Pulse: 73  Resp: 16  Temp: 98.5 F (36.9 C)  TempSrc: Oral  Weight: 195 lb 12.8 oz (88.8 kg)     Physical Exam Constitutional:      Appearance: Normal appearance.  Musculoskeletal:     Comments: Dorsum of right hand is grossly swollen. There is no redness or tenderness to palpation. He is able to form almost a complete fist. Some reduced flexion in his right PIP near where he was bitten. Good cap refill. Left hand normal.   Skin:    General: Skin is warm and dry.  Neurological:     Mental Status: He is alert and oriented to person, place, and time. Mental status is at baseline.  Psychiatric:        Mood and Affect: Mood normal.        Behavior: Behavior normal.         Assessment & Plan    1. Snake bite, subsequent encounter  Checking labwork as below per hospitalist request. I have reviewed admission notes, labs and discharge  summary. I have reconciled his medications.  - Comprehensive Metabolic Panel (CMET) - CBC with Differential  2. Colon cancer screening  Refer as below since he is due.   - Ambulatory referral to Gastroenterology  The entirety of the information documented in the History of Present Illness, Review of Systems and Physical Exam were personally obtained by me. Portions of this information were initially documented by Sheliah Hatch, CMA and reviewed by me for thoroughness and accuracy.   F/u 1 year CPE       [name], PA-C  [facility] Rae Lips as a scribe for [name], PA-C.,have documented all relevant documentation on the behalf of [name], PA-C,as  directed by  [name], PA-C while in the presence of [name], PA-C.

## 2018-08-16 NOTE — Patient Instructions (Signed)
Snake Bite Snake bite is an injury to the skin or the deeper tissues beneath the skin that is caused by a snake. There are two types of snakes: poisonous (venomous) and nonpoisonous (nonvenomous). A nonvenomous snake will cause a wound. A venomous snake will cause a wound and may also inject poison (venom) into the wound. The effects of snake venom vary depending on the type of snake. In some cases, the effects can be extremely serious or even deadly. A bite from a venomous snake is a medical emergency. Treatment may require the use of antivenom medicine. What are the causes? This injury is caused by a venomous snake or a nonvenomous snake. What increases the risk? You are more likely to get a snake bite if:  You walk or hike in outdoor areas.  You do not cover your arms and legs with clothing when hiking.  You provoke or try to pick up a snake. What are the signs or symptoms? Symptoms of a snake bite vary depending on the type of snake, whether the snake is venomous, and the severity of the bite.  Symptoms for both a venomous or nonvenomous snake may include:  Pain, redness, and swelling at the site of the bite.  Skin discoloration at the site of the bite.  A feeling of nervousness. Symptoms of a venomous snake bite may also include:  Increasing pain and swelling.  Severe anxiety or confusion.  Blood blisters or purple spots in the bite area.  Nausea and vomiting.  Numbness or tingling.  Muscle weakness.  Excessive fatigue or drowsiness.  Excessive sweating.  Difficulty breathing.  Blurred vision.  Bruising and bleeding at the site of the bite.  Feeling faint or light-headed. In some cases, symptoms do not develop until a few hours after the bite. How is this diagnosed? This condition may be diagnosed based on symptoms and a physical exam. Your health care provider will examine the bite area and ask for details about the snake to help determine whether it is  venomous. You may also have tests, including blood tests. How is this treated? Treatment depends on the severity of the bite and whether the snake is venomous.  Treatment for nonvenomous snake bites may include basic wound care. This includes cleaning the wound and applying a bandage (dressing).  Treatment for venomous snake bites may include antivenom medicine in addition to wound care. This medicine needs to be given as soon as possible after the bite. Other treatments may be needed to help control symptoms as they develop. You may need to stay in a hospital so your condition can be monitored. Your health care provider may prescribe antibiotic medicine to avoid infection in the wound. You may need a tetanus shot if it has been more than 5 years since you had one. Follow these instructions at home: Wound care   Follow instructions from your health care provider about how to take care of your wound. Make sure you: ? Wash your hands with soap and water before you change your dressing. If soap and water are not available, use hand sanitizer. ? Change your dressing as told by your health care provider.  Keep the wound clean and dry. Wash the wound daily with soap and water or a germ-killing (antiseptic) soap as told by your health care provider.  Check your wound every day for signs of infection. Watch for: ? Redness, swelling, or pain that is getting worse. ? Warmth. ? Fluid, blood, or pus.  If you   develop blistering at the site of the bite, protect the blisters from breaking. Do not attempt to open a blister.  Do not take baths, swim, or use a hot tub until your health care provider approves. Ask your health care provider if you may take showers. You may only be allowed to take sponge baths. Medicines  Take or apply over-the-counter and prescription medicines only as told by your health care provider.  If you were prescribed an antibiotic medicine, take or apply it as told by your  health care provider. Do not stop using the antibiotic even if your condition improves. General instructions  If possible, keep the affected area raised (elevated) above the level of your heart while you are sitting or lying down.  Keep all follow-up visits as told by your health care provider. This is important. Contact a health care provider if you have:  Increased redness, swelling, or pain at the site of your wound.  Fluid, blood, or pus coming from your wound.  A fever. Get help right away if:  You develop blood blisters or purple spots in the bite area.  You have: ? Nausea or vomiting. ? Numbness or tingling. ? Excessive sweating. ? Trouble breathing. ? Trouble seeing.  You feel very confused.  You feel faint or light-headed. Summary  Snake bite is an injury to the skin or the deeper tissues beneath the skin that is caused by a snake.  A nonvenomous snake will cause a wound. A venomous snake will cause a wound and may also inject poison (venom) into the wound.  The effects of snake venom vary depending on the type of snake.  Treatment depends on the severity of the bite and whether the snake is venomous. You may require antivenom or antibiotic medicine after a snake bite. This information is not intended to replace advice given to you by your health care provider. Make sure you discuss any questions you have with your health care provider. Document Released: 03/05/2000 Document Revised: 08/24/2017 Document Reviewed: 08/24/2017 Elsevier Interactive Patient Education  2019 [employer].

## 2018-08-17 LAB — COMPREHENSIVE METABOLIC PANEL
ALT: 31 IU/L (ref 0–44)
AST: 23 IU/L (ref 0–40)
Albumin/Globulin Ratio: 1.7 (ref 1.2–2.2)
Albumin: 4.5 g/dL (ref 3.8–4.9)
Alkaline Phosphatase: 63 IU/L (ref 39–117)
BUN/Creatinine Ratio: 17 (ref 10–24)
BUN: 17 mg/dL (ref 8–27)
Bilirubin Total: 0.8 mg/dL (ref 0.0–1.2)
CO2: 22 mmol/L (ref 20–29)
Calcium: 9.2 mg/dL (ref 8.6–10.2)
Chloride: 99 mmol/L (ref 96–106)
Creatinine, Ser: 1.02 mg/dL (ref 0.76–1.27)
GFR calc Af Amer: 92 mL/min/{1.73_m2} (ref >59)
GFR calc non Af Amer: 80 mL/min/{1.73_m2} (ref >59)
Globulin, Total: 2.6 g/dL (ref 1.5–4.5)
Glucose: 90 mg/dL (ref 65–99)
Potassium: 4 mmol/L (ref 3.5–5.2)
Sodium: 138 mmol/L (ref 134–144)
Total Protein: 7.1 g/dL (ref 6.0–8.5)

## 2018-08-17 LAB — CBC WITH DIFFERENTIAL/PLATELET
Basophils Absolute: 0.1 10*3/uL (ref 0.0–0.2)
Basos: 2 %
EOS (ABSOLUTE): 0.2 10*3/uL (ref 0.0–0.4)
Eos: 3 %
Hematocrit: 43.3 % (ref 37.5–51.0)
Hemoglobin: 14.7 g/dL (ref 13.0–17.7)
Immature Grans (Abs): 0 10*3/uL (ref 0.0–0.1)
Immature Granulocytes: 0 %
Lymphocytes Absolute: 1.6 10*3/uL (ref 0.7–3.1)
Lymphs: 33 %
MCH: 31.6 pg (ref 26.6–33.0)
MCHC: 33.9 g/dL (ref 31.5–35.7)
MCV: 93 fL (ref 79–97)
Monocytes Absolute: 0.6 10*3/uL (ref 0.1–0.9)
Monocytes: 12 %
Neutrophils Absolute: 2.4 10*3/uL (ref 1.4–7.0)
Neutrophils: 50 %
Platelets: 249 10*3/uL (ref 150–450)
RBC: 4.65 x10E6/uL (ref 4.14–5.80)
RDW: 12 % (ref 11.6–15.4)
WBC: 4.9 10*3/uL (ref 3.4–10.8)

## 2018-08-22 ENCOUNTER — Telehealth: Payer: Self-pay

## 2018-08-22 NOTE — Telephone Encounter (Signed)
Patient was advised.  

## 2018-08-22 NOTE — Telephone Encounter (Signed)
-----   Message from [name], [place] sent at 08/17/2018  8:17 AM EDT ----- Loney Laurence looks good. Keep follow up with ortho. Be on the lookout for call from GI to schedule coolonoscopy.

## 2018-08-23 ENCOUNTER — Other Ambulatory Visit: Payer: Self-pay

## 2018-08-23 ENCOUNTER — Encounter: Payer: Self-pay | Admitting: Physician Assistant

## 2018-08-23 ENCOUNTER — Ambulatory Visit (INDEPENDENT_AMBULATORY_CARE_PROVIDER_SITE_OTHER): Payer: No Typology Code available for payment source | Admitting: Physician Assistant

## 2018-08-23 VITALS — BP 130/84 | HR 64 | Temp 97.7°F | Resp 16 | Ht 70.0 in | Wt 194.4 lb

## 2018-08-23 DIAGNOSIS — Z0000 Encounter for general adult medical examination without abnormal findings: Secondary | ICD-10-CM | POA: Diagnosis not present

## 2018-08-23 NOTE — Progress Notes (Signed)
Patient: [name], Male    DOB: 1960/08/26, 58 y.o.   MRN: [id number] Visit Date: 08/23/2018  Today's Provider: [name], PA-C   Chief Complaint  Patient presents with  . Follow-up   Subjective:     Patient here today for CPE. Patient reports that he also needs a note to return to work. Last day patient worked was on 08/02/2018. Patient reports that he is scheduled to work this Friday 08/25/2018.  Otherwise, doing well.   Colonoscopy 02/07/2009: Due this year.  No colon cancer or prostate cancer.  Wt Readings from Last 3 Encounters:  08/23/18 194 lb 6.4 oz (88.2 kg)  08/16/18 195 lb 12.8 oz (88.8 kg)  08/06/18 189 lb 9.5 oz (86 kg)    -----------------------------------------------------------------   Review of Systems  Constitutional: Negative.   Cardiovascular: Negative.   Skin:       Swelling on right hand    Social History      He  reports that he has never smoked. He has never used smokeless tobacco. He reports that he does not drink alcohol or use drugs.       Social History   Socioeconomic History  . Marital status: Married    Spouse name: Not on file  . Number of children: Not on file  . Years of education: Not on file  . Highest education level: Not on file  Occupational History  . Not on file  Social Needs  . Financial resource strain: Not on file  . Food insecurity:    Worry: Not on file    Inability: Not on file  . Transportation needs:    Medical: Not on file    Non-medical: Not on file  Tobacco Use  . Smoking status: Never Smoker  . Smokeless tobacco: Never Used  Substance and Sexual Activity  . Alcohol use: No  . Drug use: No  . Sexual activity: Not on file  Lifestyle  . Physical activity:    Days per week: Not on file    Minutes per session: Not on file  . Stress: Not on file  Relationships  . Social connections:    Talks on phone: Not on file    Gets together: Not on file    Attends religious service: Not  on file    Active member of club or organization: Not on file    Attends meetings of clubs or organizations: Not on file    Relationship status: Not on file  Other Topics Concern  . Not on file  Social History Narrative   Married. 4 children. Works as a Engineer, civil (consulting).     Past Medical History:  Diagnosis Date  . Asthma   . Atrial flutter [facility])      Patient Active Problem List   Diagnosis Date Noted  . Snake bite 08/06/2018  . Allergic rhinitis, seasonal 11/29/2014    Past Surgical History:  Procedure Laterality Date  . ELECTROPHYSIOLOGIC STUDY     atrial flutter  . hernia (other)  1980s  . lumbar back surgery  1999    Family History        Family Status  Relation Name Status  . Mother  Alive  . Father  Alive  . Sister  Deceased  . Brother  Alive  . Sister  Alive  . Sister  Alive  . Sister  Alive        His family history includes Asthma in his sister;  Diabetes in his mother; Emphysema in his father; Glaucoma in his father; Hyperlipidemia in his mother.      No Known Allergies   Current Outpatient Medications:  .  aspirin (ASPIR-LOW) 81 MG EC tablet, Take 81 mg by mouth daily.  , Disp: , Rfl:  .  Cholecalciferol (VITAMIN D) 2000 units CAPS, Take 4,000 Units by mouth daily., Disp: , Rfl:  .  KRILL OIL PO, Take by mouth., Disp: , Rfl:  .  Multiple Vitamins-Minerals (DAILY MULTI) TABS, Take by mouth daily.  , Disp: , Rfl:  .  oxyCODONE-acetaminophen (PERCOCET) 5-325 MG tablet, Take 1 tablet by mouth every 4 (four) hours as needed., Disp: 12 tablet, Rfl: 0 .  Turmeric 500 MG CAPS, Take 1,000 mg by mouth daily., Disp: , Rfl:    Patient Care Team: Maryella Shivers as PCP - General (Physician Assistant)    Objective:    Vitals: BP 130/84 (BP Location: Left Arm, Patient Position: Sitting, Cuff Size: Large)   Pulse 64   Temp 97.7 F (36.5 C) (Oral)   Resp 16   Ht  (1.778 m)   Wt 194 lb 6.4 oz (88.2 kg)   BMI 27.89 kg/m    Vitals:   08/23/18 1026   BP: 130/84  Pulse: 64  Resp: 16  Temp: 97.7 F (36.5 C)  TempSrc: Oral  Weight: 194 lb 6.4 oz (88.2 kg)  Height:  (1.778 m)     Physical Exam Constitutional:      Appearance: Normal appearance.  Cardiovascular:     Rate and Rhythm: Normal rate and regular rhythm.     Heart sounds: Normal heart sounds.  Pulmonary:     Effort: Pulmonary effort is normal.     Breath sounds: Normal breath sounds.  Abdominal:     General: Abdomen is flat.     Palpations: Abdomen is soft.  Skin:    General: Skin is warm and dry.  Neurological:     Mental Status: He is alert.  Psychiatric:        Mood and Affect: Mood normal.        Behavior: Behavior normal.      Depression Screen PHQ 2/9 Scores 09/02/2017 12/04/2015  PHQ - 2 Score 0 0  PHQ- 9 Score 0 -       Assessment & Plan:     Routine Health Maintenance and Physical Exam  Exercise Activities and Dietary recommendations Goals   None     Immunization History  Administered Date(s) Administered  . Td 05/28/1996, 10/16/2009, 09/02/2017  . Tdap 09/24/2005, 08/05/2018    Health Maintenance  Topic Date Due  . Hepatitis C Screening  04-16-1960  . INFLUENZA VACCINE  10/21/2018  . COLONOSCOPY  09/09/2020  . TETANUS/TDAP  08/04/2028  . HIV Screening  Completed     Discussed health benefits of physical activity, and encouraged him to engage in regular exercise appropriate for his age and condition.    1. Annual physical exam  UTD on exams, recent labwork normal. Have referred for colonoscopy last visit. Patient reports he got first dose of shingrix vaccine here but I do not see this documented. Will see if we can pull vaccine logs.   The entirety of the information documented in the History of Present Illness, Review of Systems and Physical Exam were personally obtained by me. Portions of this information were initially documented by Rondel Baton, CMA and reviewed by me for thoroughness and accuracy.   F/u  1 year  CPE  --------------------------------------------------------------------    Trey SailorsAdriana M Pollak, PA-C  [facility]

## 2018-09-01 ENCOUNTER — Telehealth: Payer: Self-pay | Admitting: Physician Assistant

## 2018-09-01 NOTE — Patient Instructions (Signed)

## 2018-09-01 NOTE — Telephone Encounter (Signed)
Are we able to look at vaccine logs from 09/02/2017 for shingrix vaccine? Patient reports he got it here and bob wrote rx but I do not see it documented.

## 2018-09-04 NOTE — Telephone Encounter (Addendum)
No documentation on a shingrix vaccine in [place] or Epic.

## 2018-09-05 NOTE — Telephone Encounter (Signed)
Patient only had Td given on 09/02/2017.

## 2018-09-05 NOTE — Telephone Encounter (Signed)
Is there a way to check the sign out records?

## 2018-09-07 ENCOUNTER — Encounter: Payer: Self-pay | Admitting: Physician Assistant

## 2018-10-11 NOTE — Progress Notes (Signed)
       Patient: [name] Male    DOB: Mar 27, 1960   58 y.o.   MRN: [id number] Visit Date: 10/11/2018  Today's Provider: [name], PA-C   Chief Complaint  Patient presents with  . Leg Pain   Subjective:    Virtual Visit via Telephone Note  I connected with [name] on 10/11/18 at  8:20 AM EDT by a telephone and verified that I am speaking with the correct person using two identifiers.  Location: Patient: Home Provider: Home   I discussed the limitations of evaluation and management by telemedicine and the availability of in person appointments. The patient expressed understanding and agreed to proceed.  Patient was doing HIIT 1 week ago. Was doing jumping jacks and felt cramp in his left lower leg. He stopped, massaged leg out, and continued with his workout. Reports afterwards his leg is very sore. Pain is worsened by squatting down. Did not take any medication for it. Patient reports calf was swollen with 2+ pitting edema. Took 800 mg ibuprofen every 6 hours. Rested after injury and then decided to ride his bike yesterday, which did not bother him. May be slightly improved today. Concerned for possibility of DVT. No SOB or chest pain.  Leg Pain  Incident onset: 1 week ago. The pain is present in the left leg.    No Known Allergies   Current Outpatient Medications:  .  aspirin (ASPIR-LOW) 81 MG EC tablet, Take 81 mg by mouth daily.  , Disp: , Rfl:  .  Cholecalciferol (VITAMIN D) 2000 units CAPS, Take 4,000 Units by mouth daily., Disp: , Rfl:  .  KRILL OIL PO, Take by mouth., Disp: , Rfl:  .  Multiple Vitamins-Minerals (DAILY MULTI) TABS, Take by mouth daily.  , Disp: , Rfl:  .  oxyCODONE-acetaminophen (PERCOCET) 5-325 MG tablet, Take 1 tablet by mouth every 4 (four) hours as needed., Disp: 12 tablet, Rfl: 0 .  Turmeric 500 MG CAPS, Take 1,000 mg by mouth daily., Disp: , Rfl:   Review of Systems  Constitutional: Negative for appetite change, chills and  fever.  Respiratory: Negative for chest tightness, shortness of breath and wheezing.   Cardiovascular: Positive for leg swelling. Negative for chest pain and palpitations.  Gastrointestinal: Negative for abdominal pain, nausea and vomiting.  Musculoskeletal: Positive for myalgias (left leg pain).    Social History   Tobacco Use  . Smoking status: Never Smoker  . Smokeless tobacco: Never Used  Substance Use Topics  . Alcohol use: No      Objective:   There were no vitals taken for this visit. There were no vitals filed for this visit.   Physical Exam   No results found for any visits on 10/12/18.     Assessment & Plan    1. Left leg swelling  Will get ultrasound of left lower extremity to r/o DVT. If negative, likely tendon tear/muscle strain and can be treated conservatively.   - VAS [place] LOWER EXTREMITY VENOUS (DVT); Future  2. Left leg pain  - VAS [place] LOWER EXTREMITY VENOUS (DVT); Future  The entirety of the information documented in the History of Present Illness, Review of Systems and Physical Exam were personally obtained by me. Portions of this information were initially documented by Meyer Cory, CMA and reviewed by me for thoroughness and accuracy.   F/u PRN.     [name], PA-C  [facility]

## 2018-10-12 ENCOUNTER — Telehealth (INDEPENDENT_AMBULATORY_CARE_PROVIDER_SITE_OTHER): Payer: No Typology Code available for payment source | Admitting: Physician Assistant

## 2018-10-12 ENCOUNTER — Other Ambulatory Visit: Payer: Self-pay

## 2018-10-12 ENCOUNTER — Ambulatory Visit
Admission: RE | Admit: 2018-10-12 | Discharge: 2018-10-12 | Disposition: A | Discharge status: Home / Self Care | Payer: No Typology Code available for payment source | Source: Ambulatory Visit | Attending: Physician Assistant | Admitting: Physician Assistant

## 2018-10-12 DIAGNOSIS — M79605 Pain in left leg: Secondary | ICD-10-CM

## 2018-10-12 DIAGNOSIS — M7989 Other specified soft tissue disorders: Secondary | ICD-10-CM

## 2018-10-12 NOTE — Addendum Note (Signed)
Addended by: [name] on: 10/12/2018 10:11 AM   Modules accepted: Orders

## 2018-10-12 NOTE — Patient Instructions (Signed)

## 2018-11-13 NOTE — Progress Notes (Signed)
Patient: [name] Male    DOB: 02-Feb-1961   58 y.o.   MRN: [id number] Visit Date: 11/14/2018  Today's Provider: [name], PA-C   Chief Complaint  Patient presents with  . Hand Pain   Subjective:     Patient here today c/o right ring finger pain x's one week. Patient reports he injured finger with lawn mower. Patient reports finger is bruised and swollen. Mowing lawn with push mower, reached down and pulled grass out with blade running and it hit middle finger. Had gloves on and finger got hit - his right fourth finger. Sore when he hits it. Hand was not cut open and there was no bleeding at the time of the injury.   No Known Allergies   Current Outpatient Medications:  .  Turmeric 500 MG CAPS, Take 1,000 mg by mouth daily., Disp: , Rfl:  .  aspirin (ASPIR-LOW) 81 MG EC tablet, Take 81 mg by mouth daily.  , Disp: , Rfl:  .  Cholecalciferol (VITAMIN D) 2000 units CAPS, Take 4,000 Units by mouth daily., Disp: , Rfl:  .  KRILL OIL PO, Take by mouth., Disp: , Rfl:  .  Multiple Vitamins-Minerals (DAILY MULTI) TABS, Take by mouth daily.  , Disp: , Rfl:   Review of Systems  Constitutional: Negative for appetite change, chills and fever.  Respiratory: Negative for chest tightness, shortness of breath and wheezing.   Cardiovascular: Negative for chest pain and palpitations.  Gastrointestinal: Negative for abdominal pain, nausea and vomiting.  Musculoskeletal: Positive for myalgias.    Social History   Tobacco Use  . Smoking status: Never Smoker  . Smokeless tobacco: Never Used  Substance Use Topics  . Alcohol use: No      Objective:   BP 122/86 (BP Location: Left Arm, Patient Position: Sitting, Cuff Size: Normal)   Pulse 94   Temp (!) 96.6 F (35.9 C) (Oral)   Resp 16   Wt 188 lb (85.3 kg)   SpO2 98%   BMI 26.98 kg/m  Vitals:   11/14/18 0912  BP: 122/86  Pulse: 94  Resp: 16  Temp: (!) 96.6 F (35.9 C)  TempSrc: Oral  SpO2: 98%  Weight: 188 lb  (85.3 kg)     Physical Exam Constitutional:      Appearance: Normal appearance.  Musculoskeletal:       Hands:  Skin:    General: Skin is warm and dry.  Neurological:     Mental Status: He is alert and oriented to person, place, and time. Mental status is at baseline.  Psychiatric:        Mood and Affect: Mood normal.        Behavior: Behavior normal.      No results found for any visits on 11/14/18.     Assessment & Plan    1. Injury of finger of right hand, initial encounter  Right finger looks overall benign though some swelling is still notable. It is possible there is a fracture and I have offered xray but patient would like to observe. I think this is reasonable and the injury will likely improve.   2. Need for influenza vaccination  - Flu Vaccine QUAD 36+ mos IM  The entirety of the information documented in the History of Present Illness, Review of Systems and Physical Exam were personally obtained by me. Portions of this information were initially documented by Lynford Humphrey, CMA and reviewed by me for thoroughness  and accuracy.      [name], PA-C  [facility]

## 2018-11-14 ENCOUNTER — Other Ambulatory Visit: Payer: Self-pay

## 2018-11-14 ENCOUNTER — Ambulatory Visit (INDEPENDENT_AMBULATORY_CARE_PROVIDER_SITE_OTHER): Payer: No Typology Code available for payment source | Admitting: Physician Assistant

## 2018-11-14 ENCOUNTER — Encounter: Payer: Self-pay | Admitting: Physician Assistant

## 2018-11-14 VITALS — BP 122/86 | HR 94 | Temp 96.6°F | Resp 16 | Wt 188.0 lb

## 2018-11-14 DIAGNOSIS — Z23 Encounter for immunization: Secondary | ICD-10-CM | POA: Diagnosis not present

## 2018-11-14 DIAGNOSIS — S6991XA Unspecified injury of right wrist, hand and finger(s), initial encounter: Secondary | ICD-10-CM

## 2018-11-14 NOTE — Patient Instructions (Signed)
Finger Sprain, Adult  A finger sprain is a tear or stretch in a ligament in a finger. Ligaments are tissues that connect bones to each other.  What are the causes?  Finger sprains happen when something makes the bones in the hand move in an abnormal way. They are often caused by a fall or accident.  What increases the risk?  This condition is more likely to develop in people who:  Participate in sports in which it is easy to fall, such as skiing.  Play sports that involve catching an object, such as basketball.  Have poor strength and flexibility.  What are the signs or symptoms?  Symptoms of this condition include:  Pain or tenderness in the finger.  Swelling in the finger.  Bluish appearance to the finger.  Bruising.  Difficulty bending and flexing the finger.  How is this diagnosed?  This condition is diagnosed with an exam of your finger. Your health care provider may do an X-ray to see if any bones are broken or dislocated.  How is this treated?  Treatment for this condition depends on how severe the sprain is. It may involve:  Preventing the finger from moving for a period of time. Your finger may be wrapped in a bandage (dressing), splint, or cast, or your finger may be taped to the fingers beside it (buddy taping).  Keeping the hand raised (elevated) above the level of the heart during rest and sleep.  Medicines for pain.  Exercises to strengthen the finger. These may be recommended when the finger has healed.  Surgery to reconnect the ligament to a bone. This may be done if the ligament was torn all the way.  Follow these instructions at home:  If you have a splint:  Do not put pressure on any part of the splint until it is fully hardened. This may take several hours.  Wear the splint as told by your health care provider. Remove it only as told by your health care provider.  Loosen the splint if your fingers tingle, become numb, or turn cold and blue.  Keep the splint clean.  If the splint is not  waterproof:  Do not let it get wet.  Cover it with a watertight covering when you take a bath or a shower.  If you have a cast:  Do not put pressure on any part of the cast until it is fully hardened. This may take several hours.  Do not stick anything inside the cast to scratch your skin. Doing that increases your risk of infection.  Check the skin around the cast every day. Tell your health care provider about any concerns.  You may put lotion on dry skin around the edges of the cast. Do not put lotion on the skin underneath the cast.  Keep the cast clean.  If the cast is not waterproof:  Do not let it get wet.  Cover it with a watertight covering when you take a bath or shower.  Managing pain, stiffness, and swelling  If directed, put ice on the injured area:  If you have a removable splint, remove it as told by your health care provider.  Put ice in a plastic bag.  Place a towel between your skin and the bag or between your cast and the bag.  Leave the ice on for 20 minutes, 2-3 times a day.  Gently move your fingers often to avoid stiffness and to lessen swelling.  Elevate the injured area above   over-the-counter and prescription medicines only as told by your health care provider.  Do not drive or use heavy machinery while taking prescription pain medicine. General instructions  Keep any dressings dry until your health care provider says they can be removed.  Do exercises as told by your health care provider or physical therapist.  Do not wear rings on your injured finger.  Keep all follow-up visits as told by your health care provider. This is important. Get help right away if:  Your pain is not controlled with medicine.  Your bruising or swelling gets worse.  Your splint or cast is damaged.  Your finger is numb or blue.  Your finger feels colder  to the touch than normal.  You develop a fever. Summary  A finger sprain is a tear or stretch in a ligament in a finger. Ligaments are tissues that connect bones to each other.  Finger sprains happen when something makes the bones in the hand move in an abnormal way. They are often caused by a fall or accident.  This condition is diagnosed with an exam of your finger. Your health care provider may do an X-ray to see if any bones are broken or dislocated.  Treatment for this condition depends on how severe the sprain is. Treatment may involve wearing a splint or cast. Surgery to reconnect the ligament to a bone may be needed if the ligament was torn all the way. This information is not intended to replace advice given to you by your health care provider. Make sure you discuss any questions you have with your health care provider. Document Released: 04/15/2004 Document Revised: 02/18/2017 Document Reviewed: 05/28/2016 Elsevier Patient Education  2020 [employer].

## 2019-01-26 ENCOUNTER — Other Ambulatory Visit
Admission: RE | Admit: 2019-01-26 | Discharge: 2019-01-26 | Disposition: A | Discharge status: Home / Self Care | Payer: No Typology Code available for payment source | Source: Ambulatory Visit | Attending: Gastroenterology | Admitting: Gastroenterology

## 2019-01-26 ENCOUNTER — Other Ambulatory Visit: Payer: Self-pay

## 2019-01-26 DIAGNOSIS — Z20828 Contact with and (suspected) exposure to other viral communicable diseases: Secondary | ICD-10-CM | POA: Diagnosis not present

## 2019-01-26 DIAGNOSIS — Z01812 Encounter for preprocedural laboratory examination: Secondary | ICD-10-CM | POA: Insufficient documentation

## 2019-01-27 LAB — SARS CORONAVIRUS 2 (TAT 6-24 HRS): SARS Coronavirus 2: NEGATIVE

## 2019-01-29 ENCOUNTER — Telehealth: Payer: Self-pay | Admitting: Gastroenterology

## 2019-01-29 ENCOUNTER — Encounter: Payer: Self-pay | Admitting: *Deleted

## 2019-01-29 NOTE — Telephone Encounter (Signed)
Pt is calling he lost his  instructions and has a procedure tomorrow and he ate breakfast he needs to reschedule  cb [phone] he ate waffles and sausages.

## 2019-01-29 NOTE — Telephone Encounter (Signed)
Returned patients call to reschedule his colonoscopy originally scheduled for tomorrow.  His new date is Thursday 02/08/19 remains at [facility] with Dr. Vicente Males, advised patient of New COVID Test date of Monday 02/05/19.  New instructions will be mailed to patient along with a prescription for Suprep.  Thanks  [employer]

## 2019-02-05 ENCOUNTER — Other Ambulatory Visit
Admission: RE | Admit: 2019-02-05 | Discharge: 2019-02-05 | Disposition: A | Discharge status: Home / Self Care | Payer: No Typology Code available for payment source | Source: Ambulatory Visit | Attending: Gastroenterology | Admitting: Gastroenterology

## 2019-02-05 ENCOUNTER — Other Ambulatory Visit: Payer: Self-pay

## 2019-02-05 DIAGNOSIS — Z20828 Contact with and (suspected) exposure to other viral communicable diseases: Secondary | ICD-10-CM | POA: Insufficient documentation

## 2019-02-05 DIAGNOSIS — Z01812 Encounter for preprocedural laboratory examination: Secondary | ICD-10-CM | POA: Insufficient documentation

## 2019-02-05 LAB — SARS CORONAVIRUS 2 (TAT 6-24 HRS): SARS Coronavirus 2: NEGATIVE

## 2019-02-08 ENCOUNTER — Ambulatory Visit
Admission: RE | Admit: 2019-02-08 | Discharge: 2019-02-08 | Disposition: A | Discharge status: Home / Self Care | Payer: No Typology Code available for payment source | Attending: Gastroenterology | Admitting: Gastroenterology

## 2019-02-08 ENCOUNTER — Ambulatory Visit: Payer: No Typology Code available for payment source | Admitting: Registered Nurse

## 2019-02-08 ENCOUNTER — Encounter
Admission: RE | Disposition: A | Discharge status: Home / Self Care | Payer: Self-pay | Source: Home / Self Care | Attending: Gastroenterology

## 2019-02-08 ENCOUNTER — Encounter: Payer: Self-pay | Admitting: *Deleted

## 2019-02-08 DIAGNOSIS — Z79899 Other long term (current) drug therapy: Secondary | ICD-10-CM | POA: Insufficient documentation

## 2019-02-08 DIAGNOSIS — J45909 Unspecified asthma, uncomplicated: Secondary | ICD-10-CM | POA: Diagnosis not present

## 2019-02-08 DIAGNOSIS — I4892 Unspecified atrial flutter: Secondary | ICD-10-CM | POA: Diagnosis not present

## 2019-02-08 DIAGNOSIS — K640 First degree hemorrhoids: Secondary | ICD-10-CM | POA: Diagnosis not present

## 2019-02-08 DIAGNOSIS — Z1211 Encounter for screening for malignant neoplasm of colon: Secondary | ICD-10-CM | POA: Diagnosis present

## 2019-02-08 DIAGNOSIS — Z7982 Long term (current) use of aspirin: Secondary | ICD-10-CM | POA: Diagnosis not present

## 2019-02-08 HISTORY — PX: COLONOSCOPY WITH PROPOFOL: SHX5780

## 2019-02-08 SURGERY — COLONOSCOPY WITH PROPOFOL
Anesthesia: General

## 2019-02-08 MED ORDER — SODIUM CHLORIDE 0.9 % IV SOLN
INTRAVENOUS | Status: DC
  Administered 2019-02-08: 1000 mL via INTRAVENOUS

## 2019-02-08 MED ORDER — PROPOFOL 10 MG/ML IV BOLUS
INTRAVENOUS | Status: DC | PRN
  Administered 2019-02-08: 70 mg via INTRAVENOUS

## 2019-02-08 MED ORDER — PROPOFOL 500 MG/50ML IV EMUL
INTRAVENOUS | Status: DC | PRN
  Administered 2019-02-08: 140 ug/kg/min via INTRAVENOUS

## 2019-02-08 NOTE — Transfer of Care (Signed)
Immediate Anesthesia Transfer of Care Note  Patient: [name]  Procedure(s) Performed: COLONOSCOPY WITH PROPOFOL (N/A )  Patient Location: PACU  Anesthesia Type:General  Level of Consciousness: sedated  Airway & Oxygen Therapy: Patient Spontanous Breathing  Post-op Assessment: Report given to RN and Post -op Vital signs reviewed and stable  Post vital signs: Reviewed and stable  Last Vitals:  Vitals Value Taken Time  BP 87/65 02/08/19 0952  Temp 36.6 C 02/08/19 0952  Pulse 62 02/08/19 0952  Resp 19 02/08/19 0952  SpO2 97 % 02/08/19 0952    Last Pain:  Vitals:   02/08/19 0855  TempSrc: Skin  PainSc: 0-No pain         Complications: No apparent anesthesia complications

## 2019-02-08 NOTE — Anesthesia Post-op Follow-up Note (Signed)
Anesthesia QCDR form completed.        

## 2019-02-08 NOTE — Op Note (Signed)
[facility] Gastroenterology Patient Name: [name] Procedure Date: 02/08/2019 9:20 AM MRN: [id number] Account #: [id number] Date of Birth: 04/22/1960 Admit Type: Outpatient Age: 58 Room: [facility] ENDO ROOM 4 Gender: Male Note Status: Finalized Procedure:             Colonoscopy Indications:           Screening for colorectal malignant neoplasm Providers:             Jonathon Bellows MD, MD Referring MD:          Wendee Beavers. Terrilee Croak (Referring MD) Medicines:             Monitored Anesthesia Care Complications:         No immediate complications. Procedure:             Pre-Anesthesia Assessment:                        - Prior to the procedure, a History and Physical was                         performed, and patient medications, allergies and                         sensitivities were reviewed. The patient's tolerance                         of previous anesthesia was reviewed.                        - The risks and benefits of the procedure and the                         sedation options and risks were discussed with the                         patient. All questions were answered and informed                         consent was obtained.                        - ASA Grade Assessment: II - A patient with mild                         systemic disease.                        After obtaining informed consent, the colonoscope was                         passed under direct vision. Throughout the procedure,                         the patient's blood pressure, pulse, and oxygen                         saturations were monitored continuously. The                         Colonoscope was introduced through the anus and  advanced to the the cecum, identified by the                         appendiceal orifice. The colonoscopy was performed                         with ease. The patient tolerated the procedure well.                         The quality of  the bowel preparation was excellent. Findings:      The perianal and digital rectal examinations were normal.      Non-bleeding internal hemorrhoids were found during retroflexion. The       hemorrhoids were large and Grade I (internal hemorrhoids that do not       prolapse).      The exam was otherwise without abnormality on direct and retroflexion       views. Impression:            - Non-bleeding internal hemorrhoids.                        - The examination was otherwise normal on direct and                         retroflexion views.                        - No specimens collected. Recommendation:        - Discharge patient to home (with escort).                        - Resume previous diet.                        - Continue present medications.                        - Repeat colonoscopy in 10 years for screening                         purposes. Procedure Code(s):     --- Professional ---                        [phone], Colonoscopy, flexible; diagnostic, including                         collection of specimen(s) by brushing or washing, when                         performed (separate procedure) Diagnosis Code(s):     --- Professional ---                        Z12.11, Encounter for screening for malignant neoplasm                         of colon                        K64.0, First degree hemorrhoids CPT copyright 2019 American Medical Association. All rights reserved. The  codes documented in this report are preliminary and upon coder review may  be revised to meet current compliance requirements. Brad MoodKiran Malcom Selmer, MD Brad MoodKiran Treyvone Chelf MD, MD 02/08/2019 9:48:23 AM This report has been signed electronically. Number of Addenda: 0 Note Initiated On: 02/08/2019 9:20 AM Scope Withdrawal Time: 0 hours 11 minutes 19 seconds  Total Procedure Duration: 0 hours 16 minutes 25 seconds  Estimated Blood Loss:  Estimated blood loss: none.      [facility]

## 2019-02-08 NOTE — Anesthesia Preprocedure Evaluation (Signed)
Anesthesia Evaluation  Patient identified by MRN, date of birth, ID band Patient awake    Reviewed: Allergy & Precautions, H&P , NPO status , Patient's Chart, lab work & pertinent test results, reviewed documented beta blocker date and time   History of Anesthesia Complications Negative for: history of anesthetic complications  Airway Mallampati: I  TM Distance: >3 FB Neck ROM: full    Dental  (+) Dental Advidsory Given, Teeth Intact, Caps, Implants   Pulmonary neg shortness of breath, asthma (as a child) , neg sleep apnea, neg COPD, neg recent URI,    Pulmonary exam normal        Cardiovascular Exercise Tolerance: Good (-) hypertension(-) angina(-) Past MI and (-) Cardiac Stents Normal cardiovascular exam+ dysrhythmias (s/p ablation) Atrial Fibrillation (-) Valvular Problems/Murmurs     Neuro/Psych negative neurological ROS  negative psych ROS   GI/Hepatic negative GI ROS, Neg liver ROS,   Endo/Other  negative endocrine ROS  Renal/GU negative Renal ROS  negative genitourinary   Musculoskeletal   Abdominal   Peds  Hematology negative hematology ROS (+)   Anesthesia Other Findings Past Medical History: No date: Asthma No date: Atrial flutter (HCC)   Reproductive/Obstetrics negative OB ROS                             Anesthesia Physical Anesthesia Plan  ASA: II  Anesthesia Plan: General   Post-op Pain Management:    Induction: Intravenous  PONV Risk Score and Plan: 2 and Propofol infusion and TIVA  Airway Management Planned: Natural Airway and Nasal Cannula  Additional Equipment:   Intra-op Plan:   Post-operative Plan:   Informed Consent: I have reviewed the patients History and Physical, chart, labs and discussed the procedure including the risks, benefits and alternatives for the proposed anesthesia with the patient or authorized representative who has indicated his/her  understanding and acceptance.     Dental Advisory Given  Plan Discussed with: Anesthesiologist, CRNA and Surgeon  Anesthesia Plan Comments:         Anesthesia Quick Evaluation

## 2019-02-08 NOTE — Anesthesia Postprocedure Evaluation (Signed)
Anesthesia Post Note  Patient: [name]  Procedure(s) Performed: COLONOSCOPY WITH PROPOFOL (N/A )  Patient location during evaluation: Endoscopy Anesthesia Type: General Level of consciousness: awake and alert Pain management: pain level controlled Vital Signs Assessment: post-procedure vital signs reviewed and stable Respiratory status: spontaneous breathing, nonlabored ventilation, respiratory function stable and patient connected to nasal cannula oxygen Cardiovascular status: blood pressure returned to baseline and stable Postop Assessment: no apparent nausea or vomiting Anesthetic complications: no     Last Vitals:  Vitals:   02/08/19 1029 02/08/19 1031  BP:  119/79  Pulse: (!) 54 (!) 50  Resp: 17 16  Temp:    SpO2: 100% 100%    Last Pain:  Vitals:   02/08/19 1031  TempSrc:   PainSc: 0-No pain                 Martha Clan

## 2019-02-08 NOTE — H&P (Signed)
Wyline Mood, MD [address], [address], [place], [place], [address] [address], [place], [place], [address] Phone: [phone]  Fax: [phone]  Primary Care Physician:  [name], PA-C   Pre-Procedure History & Physical: HPI:  [name] is a 58 y.o. male is here for an colonoscopy.   Past Medical History:  Diagnosis Date  . Asthma   . Atrial flutter [facility])     Past Surgical History:  Procedure Laterality Date  . BACK SURGERY    . ELECTROPHYSIOLOGIC STUDY     atrial flutter  . hernia (other)  1980s  . lumbar back surgery  1999    Prior to Admission medications   Medication Sig Start Date End Date Taking? Authorizing Provider  aspirin (ASPIR-LOW) 81 MG EC tablet Take 81 mg by mouth daily.      [provider]  Cholecalciferol (VITAMIN D) 2000 units CAPS Take 4,000 Units by mouth daily.    [provider]  KRILL OIL PO Take by mouth.    [provider]  Multiple Vitamins-Minerals (DAILY MULTI) TABS Take by mouth daily.      [provider]  Turmeric 500 MG CAPS Take 1,000 mg by mouth daily.    [provider]    Allergies as of 08/16/2018  . (No Known Allergies)    Family History  Problem Relation Age of Onset  . Diabetes Mother   . Hyperlipidemia Mother   . Emphysema Father   . Glaucoma Father        open-angle  . Asthma Sister     Social History   Socioeconomic History  . Marital status: Married    Spouse name: Not on file  . Number of children: Not on file  . Years of education: Not on file  . Highest education level: Not on file  Occupational History  . Not on file  Social Needs  . Financial resource strain: Not on file  . Food insecurity    Worry: Not on file    Inability: Not on file  . Transportation needs    Medical: Not on file    Non-medical: Not on file  Tobacco Use  . Smoking status: Never Smoker  . Smokeless tobacco: Never Used  Substance and Sexual  Activity  . Alcohol use: No  . Drug use: No  . Sexual activity: Not on file  Lifestyle  . Physical activity    Days per week: Not on file    Minutes per session: Not on file  . Stress: Not on file  Relationships  . Social Musician on phone: Not on file    Gets together: Not on file    Attends religious service: Not on file    Active member of club or organization: Not on file    Attends meetings of clubs or organizations: Not on file    Relationship status: Not on file  . Intimate partner violence    Fear of current or ex partner: Not on file    Emotionally abused: Not on file    Physically abused: Not on file    Forced sexual activity: Not on file  Other Topics Concern  . Not on file  Social History Narrative   Married. 4 children. Works as a Engineer, civil (consulting).     Review of Systems: See HPI, otherwise negative ROS  Physical Exam: BP (!) 131/91   Pulse 66   Temp 97.8 F (  36.6 C) (Skin)   Resp 16   Ht 5\' 10"  (1.778 m)   Wt 83.9 kg   SpO2 100%   BMI 26.54 kg/m  General:   Alert,  pleasant and cooperative in NAD Head:  Normocephalic and atraumatic. Neck:  Supple; no masses or thyromegaly. Lungs:  Clear throughout to auscultation, normal respiratory effort.    Heart:  +S1, +S2, Regular rate and rhythm, No edema. Abdomen:  Soft, nontender and nondistended. Normal bowel sounds, without guarding, and without rebound.   Neurologic:  Alert and  oriented x4;  grossly normal neurologically.  Impression/Plan: [name] is here for an colonoscopy to be performed for Screening colonoscopy average risk   Risks, benefits, limitations, and alternatives regarding  colonoscopy have been reviewed with the patient.  Questions have been answered.  All parties agreeable.   Jonathon Bellows, MD  02/08/2019, 9:05 AM

## 2019-02-09 ENCOUNTER — Encounter: Payer: Self-pay | Admitting: Gastroenterology

## 2019-10-17 ENCOUNTER — Ambulatory Visit (INDEPENDENT_AMBULATORY_CARE_PROVIDER_SITE_OTHER): Payer: No Typology Code available for payment source | Admitting: Physician Assistant

## 2019-10-17 ENCOUNTER — Other Ambulatory Visit: Payer: Self-pay

## 2019-10-17 ENCOUNTER — Encounter: Payer: Self-pay | Admitting: Physician Assistant

## 2019-10-17 VITALS — BP 136/84 | HR 56 | Temp 96.8°F | Ht 70.0 in | Wt 190.0 lb

## 2019-10-17 DIAGNOSIS — Z0000 Encounter for general adult medical examination without abnormal findings: Secondary | ICD-10-CM | POA: Diagnosis not present

## 2019-10-17 DIAGNOSIS — M25512 Pain in left shoulder: Secondary | ICD-10-CM | POA: Diagnosis not present

## 2019-10-17 NOTE — Patient Instructions (Addendum)
Preventive Care 41-61 Years Old, Male Preventive care refers to lifestyle choices and visits with your health care provider that can promote health and wellness. This includes:  A yearly physical exam. This is also called an annual well check.  Regular dental and eye exams.  Immunizations.  Screening for certain conditions.  Healthy lifestyle choices, such as eating a healthy diet, getting regular exercise, not using drugs or products that contain nicotine and tobacco, and limiting alcohol use. What can I expect for my preventive care visit? Physical exam Your health care provider will check:  Height and weight. These may be used to calculate body mass index (BMI), which is a measurement that tells if you are at a healthy weight.  Heart rate and blood pressure.  Your skin for abnormal spots. Counseling Your health care provider may ask you questions about:  Alcohol, tobacco, and drug use.  Emotional well-being.  Home and relationship well-being.  Sexual activity.  Eating habits.  Work and work Statistician. What immunizations do I need?  Influenza (flu) vaccine  This is recommended every year. Tetanus, diphtheria, and pertussis (Tdap) vaccine  You may need a Td booster every 10 years. Varicella (chickenpox) vaccine  You may need this vaccine if you have not already been vaccinated. Zoster (shingles) vaccine  You may need this after age 61. Measles, mumps, and rubella (MMR) vaccine  You may need at least one dose of MMR if you were born in 1957 or later. You may also need a second dose. Pneumococcal conjugate (PCV13) vaccine  You may need this if you have certain conditions and were not previously vaccinated. Pneumococcal polysaccharide (PPSV23) vaccine  You may need one or two doses if you smoke cigarettes or if you have certain conditions. Meningococcal conjugate (MenACWY) vaccine  You may need this if you have certain conditions. Hepatitis A  vaccine  You may need this if you have certain conditions or if you travel or work in places where you may be exposed to hepatitis A. Hepatitis B vaccine  You may need this if you have certain conditions or if you travel or work in places where you may be exposed to hepatitis B. Haemophilus influenzae type b (Hib) vaccine  You may need this if you have certain risk factors. Human papillomavirus (HPV) vaccine  If recommended by your health care provider, you may need three doses over 6 months. You may receive vaccines as individual doses or as more than one vaccine together in one shot (combination vaccines). Talk with your health care provider about the risks and benefits of combination vaccines. What tests do I need? Blood tests  Lipid and cholesterol levels. These may be checked every 5 years, or more frequently if you are over 61 years old.  Hepatitis C test.  Hepatitis B test. Screening  Lung cancer screening. You may have this screening every year starting at age 61 if you have a 30-pack-year history of smoking and currently smoke or have quit within the past 15 years.  Prostate cancer screening. Recommendations will vary depending on your family history and other risks.  Colorectal cancer screening. All adults should have this screening starting at age 61 and continuing until age 2. Your health care provider may recommend screening at age 61 if you are at increased risk. You will have tests every 1-10 years, depending on your results and the type of screening test.  Diabetes screening. This is done by checking your blood sugar (glucose) after you have not eaten  for a while (fasting). You may have this done every 1-3 years.  Sexually transmitted disease (STD) testing. Follow these instructions at home: Eating and drinking  Eat a diet that includes fresh fruits and vegetables, whole grains, lean protein, and low-fat dairy products.  Take vitamin and mineral supplements as  recommended by your health care provider.  Do not drink alcohol if your health care provider tells you not to drink.  If you drink alcohol: ? Limit how much you have to 0-2 drinks a day. ? Be aware of how much alcohol is in your drink. In the [place] one drink equals one 12 oz bottle of beer (355 mL), one 5 oz glass of wine (148 mL), or one 1 oz glass of hard liquor (44 mL). Lifestyle  Take daily care of your teeth and gums.  Stay active. Exercise for at least 30 minutes on 5 or more days each week.  Do not use any products that contain nicotine or tobacco, such as cigarettes, e-cigarettes, and chewing tobacco. If you need help quitting, ask your health care provider.  If you are sexually active, practice safe sex. Use a condom or other form of protection to prevent STIs (sexually transmitted infections).  Talk with your health care provider about taking a low-dose aspirin every day starting at age 61. What's next?  Go to your health care provider once a year for a well check visit.  Ask your health care provider how often you should have your eyes and teeth checked.  Stay up to date on all vaccines. This information is not intended to replace advice given to you by your health care provider. Make sure you discuss any questions you have with your health care provider. Document Revised: 03/02/2018 Document Reviewed: 03/02/2018 Elsevier Patient Education  [place].  Shoulder Pain Many things can cause shoulder pain, including:  An injury.  Moving the shoulder in the same way again and again (overuse).  Joint pain (arthritis). Pain can come from:  Swelling and irritation (inflammation) of any part of the shoulder.  An injury to the shoulder joint.  An injury to: ? Tissues that connect muscle to bone (tendons). ? Tissues that connect bones to each other (ligaments). ? Bones. Follow these instructions at home: Watch for changes in your symptoms. Let your doctor  know about them. Follow these instructions to help with your pain. If you have a sling:  Wear the sling as told by your doctor. Remove it only as told by your doctor.  Loosen the sling if your fingers: ? Tingle. ? Become numb. ? Turn cold and blue.  Keep the sling clean.  If the sling is not waterproof: ? Do not let it get wet. ? Take the sling off when you shower or bathe. Managing pain, stiffness, and swelling   If told, put ice on the painful area: ? Put ice in a plastic bag. ? Place a towel between your skin and the bag. ? Leave the ice on for 20 minutes, 2-3 times a day. Stop putting ice on if it does not help with the pain.  Squeeze a soft ball or a foam pad as much as possible. This prevents swelling in the shoulder. It also helps to strengthen the arm. General instructions  Take over-the-counter and prescription medicines only as told by your doctor.  Keep all follow-up visits as told by your doctor. This is important. Contact a doctor if:  Your pain gets worse.  Medicine does not  help your pain.  You have new pain in your arm, hand, or fingers. Get help right away if:  Your arm, hand, or fingers: ? Tingle. ? Are numb. ? Are swollen. ? Are painful. ? Turn white or blue. Summary  Shoulder pain can be caused by many things. These include injury, moving the shoulder in the same away again and again, and joint pain.  Watch for changes in your symptoms. Let your doctor know about them.  This condition may be treated with a sling, ice, and pain medicine.  Contact your doctor if the pain gets worse or you have new pain. Get help right away if your arm, hand, or fingers tingle or get numb, swollen, or painful.  Keep all follow-up visits as told by your doctor. This is important. This information is not intended to replace advice given to you by your health care provider. Make sure you discuss any questions you have with your health care provider. Document  Revised: 09/20/2017 Document Reviewed: 09/20/2017 Elsevier Patient Education  [place].

## 2019-10-17 NOTE — Progress Notes (Signed)
I,[name],acting as a Neurosurgeon for [employer], PA-C.,have documented all relevant documentation on the behalf of Brad Sailors, PA-C,as directed by  Brad Sailors, PA-C while in the presence of Brad Sailors, PA-C.   Complete physical exam   Patient: [name]   DOB: 1961/02/14   59 y.o. Male  MRN: [id number] Visit Date: 10/17/2019  Today's healthcare provider: [name], PA-C   Chief Complaint  Patient presents with  . Annual Exam  . Shoulder Pain   Subjective    [name] is a 59 y.o. male who presents today for a complete physical exam.  He reports consuming a general diet. exercises regularly He generally feels well. He reports sleeping fairly well. He does have additional problems to discuss today.  Shoulder Pain  The pain is present in the left shoulder. This is a new problem. The current episode started 1 to 4 weeks ago. The problem occurs constantly. Pertinent negatives include no inability to bear weight, joint locking, joint swelling, limited range of motion or stiffness. He has tried NSAIDS for the symptoms. The treatment provided mild relief.   Patient frequently lifts heavy weights upwards of 100 lbs and does full body workouts including pull ups.     Past Medical History:  Diagnosis Date  . Asthma   . Atrial flutter [facility])    Past Surgical History:  Procedure Laterality Date  . BACK SURGERY    . COLONOSCOPY WITH PROPOFOL N/A 02/08/2019   Procedure: COLONOSCOPY WITH PROPOFOL;  Surgeon: Wyline Mood, MD;  Location: [facility] ENDOSCOPY;  Service: Gastroenterology;  Laterality: N/A;  . ELECTROPHYSIOLOGIC STUDY     atrial flutter  . hernia (other)  1980s  . lumbar back surgery  1999   Social History   Socioeconomic History  . Marital status: Married    Spouse name: Not on file  . Number of children: Not on file  . Years of education: Not on file  . Highest education level: Not on file  Occupational History  . Not on file    Tobacco Use  . Smoking status: Never Smoker  . Smokeless tobacco: Never Used  Vaping Use  . Vaping Use: Never used  Substance and Sexual Activity  . Alcohol use: No  . Drug use: No  . Sexual activity: Not on file  Other Topics Concern  . Not on file  Social History Narrative   Married. 4 children. Works as a Engineer, civil (consulting).    Social Determinants of Health   Financial Resource Strain:   . Difficulty of Paying Living Expenses:   Food Insecurity:   . Worried About Programme researcher, broadcasting/film/video in the Last Year:   . Barista in the Last Year:   Transportation Needs:   . Freight forwarder (Medical):   Marland Kitchen Lack of Transportation (Non-Medical):   Physical Activity:   . Days of Exercise per Week:   . Minutes of Exercise per Session:   Stress:   . Feeling of Stress :   Social Connections:   . Frequency of Communication with Friends and Family:   . Frequency of Social Gatherings with Friends and Family:   . Attends Religious Services:   . Active Member of Clubs or Organizations:   . Attends Banker Meetings:   Marland Kitchen Marital Status:   Intimate Partner Violence:   . Fear of Current or Ex-Partner:   . Emotionally Abused:   Marland Kitchen Physically Abused:   .  Sexually Abused:    Family Status  Relation Name Status  . Mother  Alive  . Father  Deceased  . Sister  Deceased  . Brother  Alive  . Sister  Alive  . Sister  Alive  . Sister  Alive   Family History  Problem Relation Age of Onset  . Diabetes Mother   . Hyperlipidemia Mother   . Glaucoma Mother   . Emphysema Father   . Glaucoma Father        open-angle  . Asthma Sister    No Known Allergies  Patient Care Team: Maryella Shivers as PCP - General (Physician Assistant)   Medications: Outpatient Medications Prior to Visit  Medication Sig  . Cholecalciferol (VITAMIN D) 2000 units CAPS Take 4,000 Units by mouth daily.  Marland Kitchen KRILL OIL PO Take by mouth.  . Multiple Vitamins-Minerals (DAILY MULTI) TABS Take by mouth  daily.    Marland Kitchen aspirin (ASPIR-LOW) 81 MG EC tablet Take 81 mg by mouth daily.   (Patient not taking: Reported on 10/17/2019)  . Turmeric 500 MG CAPS Take 1,000 mg by mouth daily. (Patient not taking: Reported on 10/17/2019)   No facility-administered medications prior to visit.    Review of Systems  Constitutional: Negative.   HENT: Negative.   Eyes: Negative.   Respiratory: Negative.   Cardiovascular: Negative.   Gastrointestinal: Negative.   Endocrine: Negative.   Genitourinary: Negative.   Musculoskeletal: Positive for arthralgias. Negative for back pain, gait problem, joint swelling, myalgias, neck pain, neck stiffness and stiffness.  Skin: Negative.   Allergic/Immunologic: Negative.   Neurological: Negative.   Hematological: Negative.   Psychiatric/Behavioral: Negative.     Last CBC Lab Results  Component Value Date   WBC 4.0 10/17/2019   HGB 14.4 10/17/2019   HCT 42.2 10/17/2019   MCV 93 10/17/2019   MCH 31.7 10/17/2019   RDW 11.9 10/17/2019   PLT 220 10/17/2019   Last metabolic panel Lab Results  Component Value Date   GLUCOSE 91 10/17/2019   NA 140 10/17/2019   K 3.9 10/17/2019   CL 102 10/17/2019   CO2 23 10/17/2019   BUN 14 10/17/2019   CREATININE 1.22 10/17/2019   GFRNONAA 64 10/17/2019   GFRAA 74 10/17/2019   CALCIUM 9.1 10/17/2019   PHOS 3.2 03/06/2015   PROT 6.8 10/17/2019   ALBUMIN 4.3 10/17/2019   LABGLOB 2.5 10/17/2019   AGRATIO 1.7 10/17/2019   BILITOT 1.3 (H) 10/17/2019   ALKPHOS 61 10/17/2019   AST 33 10/17/2019   ALT 31 10/17/2019   ANIONGAP 8 08/07/2018   Last lipids Lab Results  Component Value Date   CHOL 176 10/17/2019   HDL 64 10/17/2019   LDLCALC 104 (H) 10/17/2019   TRIG 39 10/17/2019   CHOLHDL 2.8 10/17/2019   Last hemoglobin A1c Lab Results  Component Value Date   HGBA1C 5.2 10/17/2019      Objective    BP (!) 136/84 (BP Location: Left Arm, Patient Position: Sitting, Cuff Size: Large)   Pulse 56   Temp (!) 96.8 F  (36 C) (Temporal)   Ht 5\' 10"  (1.778 m)   Wt 190 lb (86.2 kg)   BMI 27.26 kg/m    Physical Exam Constitutional:      Appearance: Normal appearance.  Cardiovascular:     Rate and Rhythm: Normal rate and regular rhythm.     Heart sounds: Normal heart sounds.  Pulmonary:     Effort: Pulmonary effort is normal.  Breath sounds: Normal breath sounds.  Abdominal:     General: Bowel sounds are normal.     Palpations: Abdomen is soft.  Skin:    General: Skin is warm and dry.  Neurological:     Mental Status: He is alert and oriented to person, place, and time. Mental status is at baseline.  Psychiatric:        Mood and Affect: Mood normal.        Behavior: Behavior normal.        Thought Content: Thought content normal.        Judgment: Judgment normal.       Last depression screening scores PHQ 2/9 Scores 10/17/2019 09/02/2017 12/04/2015  PHQ - 2 Score 0 0 0  PHQ- 9 Score 0 0 -   Last fall risk screening Fall Risk  10/17/2019  Falls in the past year? 0  Number falls in past yr: 0  Injury with Fall? 0  Follow up Falls evaluation completed   Last Audit-C alcohol use screening Alcohol Use Disorder Test (AUDIT) 10/17/2019  1. How often do you have a drink containing alcohol? 0  2. How many drinks containing alcohol do you have on a typical day when you are drinking? 0  3. How often do you have six or more drinks on one occasion? 0  AUDIT-C Score 0  Alcohol Brief Interventions/Follow-up AUDIT Score <7 follow-up not indicated   A score of 3 or more in women, and 4 or more in men indicates increased risk for alcohol abuse, EXCEPT if all of the points are from question 1   Results for orders placed or performed in visit on 10/17/19  CBC with Differential/Platelet  Result Value Ref Range   WBC 4.0 3.4 - 10.8 x10E3/uL   RBC 4.54 4.14 - 5.80 x10E6/uL   Hemoglobin 14.4 13.0 - 17.7 g/dL   Hematocrit 11.6 57.9 - 51.0 %   MCV 93 79 - 97 fL   MCH 31.7 26.6 - 33.0 pg   MCHC 34.1  31 - 35 g/dL   RDW 03.8 33.3 - 83.2 %   Platelets 220 150 - 450 x10E3/uL   Neutrophils 53 Not Estab. %   Lymphs 30 Not Estab. %   Monocytes 12 Not Estab. %   Eos 4 Not Estab. %   Basos 1 Not Estab. %   Neutrophils Absolute 2.2 1 - 7 x10E3/uL   Lymphocytes Absolute 1.2 0 - 3 x10E3/uL   Monocytes Absolute 0.5 0 - 0 x10E3/uL   EOS (ABSOLUTE) 0.1 0.0 - 0.4 x10E3/uL   Basophils Absolute 0.0 0 - 0 x10E3/uL   Immature Granulocytes 0 Not Estab. %   Immature Grans (Abs) 0.0 0.0 - 0.1 x10E3/uL  Lipid panel  Result Value Ref Range   Cholesterol, Total 176 100 - 199 mg/dL   Triglycerides 39 0 - 149 mg/dL   HDL 64 >91 mg/dL   VLDL Cholesterol Cal 8 5 - 40 mg/dL   LDL Chol Calc (NIH) 916 (H) 0 - 99 mg/dL   Chol/HDL Ratio 2.8 0.0 - 5.0 ratio  TSH  Result Value Ref Range   TSH 1.290 0.450 - 4.500 uIU/mL  Comprehensive metabolic panel  Result Value Ref Range   Glucose 91 65 - 99 mg/dL   BUN 14 8 - 27 mg/dL   Creatinine, Ser 6.06 0.76 - 1.27 mg/dL   GFR calc non Af Amer 64 >59 mL/min/1.73   GFR calc Af Amer 74 >59 mL/min/1.73   BUN/Creatinine  Ratio 11 10 - 24   Sodium 140 134 - 144 mmol/L   Potassium 3.9 3.5 - 5.2 mmol/L   Chloride 102 96 - 106 mmol/L   CO2 23 20 - 29 mmol/L   Calcium 9.1 8.6 - 10.2 mg/dL   Total Protein 6.8 6.0 - 8.5 g/dL   Albumin 4.3 3.8 - 4.8 g/dL   Globulin, Total 2.5 1.5 - 4.5 g/dL   Albumin/Globulin Ratio 1.7 1.2 - 2.2   Bilirubin Total 1.3 (H) 0.0 - 1.2 mg/dL   Alkaline Phosphatase 61 48 - 121 IU/L   AST 33 0 - 40 IU/L   ALT 31 0 - 44 IU/L  Hemoglobin A1c  Result Value Ref Range   Hgb A1c MFr Bld 5.2 4.8 - 5.6 %   Est. average glucose Bld gHb Est-mCnc 103 mg/dL    Assessment & Plan    Routine Health Maintenance and Physical Exam  Exercise Activities and Dietary recommendations Goals   None     Immunization History  Administered Date(s) Administered  . Influenza,inj,Quad PF,6+ Mos 11/14/2018  . Td 05/28/1996, 10/16/2009, 09/02/2017  . Tdap  09/24/2005, 08/05/2018    Health Maintenance  Topic Date Due  . Hepatitis C Screening  Never done  . INFLUENZA VACCINE  10/21/2019  . TETANUS/TDAP  08/04/2028  . COLONOSCOPY  02/07/2029  . HIV Screening  Completed    Discussed health benefits of physical activity, and encouraged him to engage in regular exercise appropriate for his age and condition.  1. Annual physical exam  - CBC with Differential/Platelet - Lipid panel - TSH - Comprehensive metabolic panel - Hemoglobin A1c  2. Acute pain of left shoulder  Likely impingement syndrome. Recommend rest, NSAIDs.    Return in about 1 year (around 10/16/2020) for CPE.     Maryella Shivers.   Yoshiko Keleher M Nivin Braniff, PA-C  [facility] [phone] (phone) [phone] (fax)  [facility]

## 2019-10-18 LAB — CBC WITH DIFFERENTIAL/PLATELET
Basophils Absolute: 0 10*3/uL (ref 0.0–0.2)
Basos: 1 %
EOS (ABSOLUTE): 0.1 10*3/uL (ref 0.0–0.4)
Eos: 4 %
Hematocrit: 42.2 % (ref 37.5–51.0)
Hemoglobin: 14.4 g/dL (ref 13.0–17.7)
Immature Grans (Abs): 0 10*3/uL (ref 0.0–0.1)
Immature Granulocytes: 0 %
Lymphocytes Absolute: 1.2 10*3/uL (ref 0.7–3.1)
Lymphs: 30 %
MCH: 31.7 pg (ref 26.6–33.0)
MCHC: 34.1 g/dL (ref 31.5–35.7)
MCV: 93 fL (ref 79–97)
Monocytes Absolute: 0.5 10*3/uL (ref 0.1–0.9)
Monocytes: 12 %
Neutrophils Absolute: 2.2 10*3/uL (ref 1.4–7.0)
Neutrophils: 53 %
Platelets: 220 10*3/uL (ref 150–450)
RBC: 4.54 x10E6/uL (ref 4.14–5.80)
RDW: 11.9 % (ref 11.6–15.4)
WBC: 4 10*3/uL (ref 3.4–10.8)

## 2019-10-18 LAB — COMPREHENSIVE METABOLIC PANEL
ALT: 31 IU/L (ref 0–44)
AST: 33 IU/L (ref 0–40)
Albumin/Globulin Ratio: 1.7 (ref 1.2–2.2)
Albumin: 4.3 g/dL (ref 3.8–4.8)
Alkaline Phosphatase: 61 IU/L (ref 48–121)
BUN/Creatinine Ratio: 11 (ref 10–24)
BUN: 14 mg/dL (ref 8–27)
Bilirubin Total: 1.3 mg/dL — ABNORMAL HIGH (ref 0.0–1.2)
CO2: 23 mmol/L (ref 20–29)
Calcium: 9.1 mg/dL (ref 8.6–10.2)
Chloride: 102 mmol/L (ref 96–106)
Creatinine, Ser: 1.22 mg/dL (ref 0.76–1.27)
GFR calc Af Amer: 74 mL/min/{1.73_m2} (ref >59)
GFR calc non Af Amer: 64 mL/min/{1.73_m2} (ref >59)
Globulin, Total: 2.5 g/dL (ref 1.5–4.5)
Glucose: 91 mg/dL (ref 65–99)
Potassium: 3.9 mmol/L (ref 3.5–5.2)
Sodium: 140 mmol/L (ref 134–144)
Total Protein: 6.8 g/dL (ref 6.0–8.5)

## 2019-10-18 LAB — HEMOGLOBIN A1C
Est. average glucose Bld gHb Est-mCnc: 103 mg/dL
Hgb A1c MFr Bld: 5.2 % (ref 4.8–5.6)

## 2019-10-18 LAB — LIPID PANEL
Chol/HDL Ratio: 2.8 ratio (ref 0.0–5.0)
Cholesterol, Total: 176 mg/dL (ref 100–199)
HDL: 64 mg/dL (ref >39)
LDL Chol Calc (NIH): 104 mg/dL — ABNORMAL HIGH (ref 0–99)
Triglycerides: 39 mg/dL (ref 0–149)
VLDL Cholesterol Cal: 8 mg/dL (ref 5–40)

## 2019-10-18 LAB — TSH: TSH: 1.29 u[IU]/mL (ref 0.450–4.500)

## 2019-12-21 ENCOUNTER — Other Ambulatory Visit: Payer: No Typology Code available for payment source

## 2019-12-21 DIAGNOSIS — Z20822 Contact with and (suspected) exposure to covid-19: Secondary | ICD-10-CM

## 2019-12-23 LAB — NOVEL CORONAVIRUS, NAA: SARS-CoV-2, NAA: NOT DETECTED

## 2019-12-23 LAB — SARS-COV-2, NAA 2 DAY TAT

## 2020-04-22 ENCOUNTER — Other Ambulatory Visit: Payer: Self-pay

## 2020-04-22 ENCOUNTER — Encounter: Payer: Self-pay | Admitting: Physician Assistant

## 2020-04-22 ENCOUNTER — Ambulatory Visit (INDEPENDENT_AMBULATORY_CARE_PROVIDER_SITE_OTHER): Payer: No Typology Code available for payment source | Admitting: Physician Assistant

## 2020-04-22 VITALS — BP 135/103 | HR 68 | Temp 97.9°F | Wt 198.3 lb

## 2020-04-22 DIAGNOSIS — H9319 Tinnitus, unspecified ear: Secondary | ICD-10-CM

## 2020-04-22 DIAGNOSIS — H9202 Otalgia, left ear: Secondary | ICD-10-CM | POA: Diagnosis not present

## 2020-04-22 DIAGNOSIS — M25512 Pain in left shoulder: Secondary | ICD-10-CM | POA: Diagnosis not present

## 2020-04-22 DIAGNOSIS — H60502 Unspecified acute noninfective otitis externa, left ear: Secondary | ICD-10-CM | POA: Diagnosis not present

## 2020-04-22 MED ORDER — CIPROFLOXACIN-DEXAMETHASONE 0.3-0.1 % OT SUSP: 4.0000 [drp] | Freq: Two times a day (BID) | OTIC | 0 refills | Status: DC

## 2020-04-22 NOTE — Progress Notes (Signed)
Established patient visit   Patient: [name]   DOB: [birth year]   60 y.o. Male  MRN: [id number] Visit Date: 04/22/2020  Today's healthcare provider: [name], PA-C   Chief Complaint  Patient presents with  . Ear Pain  . Tinnitus  I,Porsha C McClurkin,acting as a scribe for [name], PA-C.,have documented all relevant documentation on the behalf of [name], PA-C,as directed by  [name], PA-C while in the presence of [name], PA-C.  Subjective    Otalgia  There is pain in the left ear. This is a new problem. The current episode started in the past 7 days. The problem occurs constantly. The problem has been unchanged. There has been no fever. The pain is at a severity of 6/10. The pain is moderate. Pertinent negatives include no ear discharge, headaches or hearing loss. He has tried nothing for the symptoms. The treatment provided no relief.  He also reports ringing in the left ear for 3 weeks. Most recently in the past three days he started having pain in his left ear as well. He had this problem previously which he attributes to wearing foam ear plugs. His left ear is not painful to the touch currently. No trouble hearing. He does use ear plugs currently though infrequently. Denies URI.   Is still having some intermittent left shoulder pain, this has been present since 09/2019. He reports it is somewhat better. Continues to work out. Continues to lift.   Wt Readings from Last 3 Encounters:  04/22/20 198 lb 4.8 oz (89.9 kg)  10/17/19 190 lb (86.2 kg)  02/08/19 185 lb (83.9 kg)   BP Readings from Last 3 Encounters:  04/22/20 (!) 149/95  10/17/19 (!) 136/84  02/08/19 119/79        Medications: Outpatient Medications Prior to Visit  Medication Sig  . Cholecalciferol (VITAMIN D) 2000 units CAPS Take 4,000 Units by mouth daily.  . Multiple Vitamins-Minerals (DAILY MULTI) TABS Take by mouth daily.  Marland Kitchen aspirin 81 MG EC tablet Take  81 mg by mouth daily.   (Patient not taking: No sig reported)  . KRILL OIL PO Take by mouth. (Patient not taking: Reported on 04/22/2020)  . Turmeric 500 MG CAPS Take 1,000 mg by mouth daily. (Patient not taking: No sig reported)   No facility-administered medications prior to visit.    Review of Systems  HENT: Positive for ear pain. Negative for ear discharge and hearing loss.   Neurological: Negative for dizziness, light-headedness and headaches.       Objective    BP (!) 149/95 (BP Location: Left Arm, Patient Position: Sitting, Cuff Size: Large)   Pulse 68   Temp 97.9 F (36.6 C) (Oral)   Wt 198 lb 4.8 oz (89.9 kg)   SpO2 99%   BMI 28.45 kg/m     Physical Exam Constitutional:      Appearance: Normal appearance.  HENT:     Right Ear: Tympanic membrane and ear canal normal.     Left Ear: Tympanic membrane normal. Swelling present.  Cardiovascular:     Rate and Rhythm: Normal rate.  Pulmonary:     Effort: Pulmonary effort is normal.  Skin:    General: Skin is dry.  Neurological:     General: No focal deficit present.     Mental Status: He is alert and oriented to person, place, and time.  Psychiatric:        Mood  and Affect: Mood normal.        Behavior: Behavior normal.       No results found for any visits on 04/22/20.  Assessment & Plan    1. Tinnitus, unspecified laterality   2. Left ear pain  - ciprofloxacin-dexamethasone (CIPRODEX) OTIC suspension; Place 4 drops into the left ear 2 (two) times daily.  Dispense: 7.5 mL; Refill: 0  3. Acute otitis externa of left ear, unspecified type  - ciprofloxacin-dexamethasone (CIPRODEX) OTIC suspension; Place 4 drops into the left ear 2 (two) times daily.  Dispense: 7.5 mL; Refill: 0  4. Left shoulder pain, unspecified chronicity  Will observe, may consider xray, steroid shot, further workup with orthopedist. He frequently lifts weight and is very active.     No follow-ups on file.      ITrey Sailors, PA-C, have reviewed all documentation for this visit. The documentation on 04/22/20 for the exam, diagnosis, procedures, and orders are all accurate and complete.  The entirety of the information documented in the History of Present Illness, Review of Systems and Physical Exam were personally obtained by me. Portions of this information were initially documented by [facility] and reviewed by me for thoroughness and accuracy.     Maryella Shivers  [facility] [phone] (phone) [phone] (fax)  [facility]

## 2020-05-08 ENCOUNTER — Encounter: Payer: Self-pay | Admitting: Physician Assistant

## 2020-05-08 DIAGNOSIS — H9312 Tinnitus, left ear: Secondary | ICD-10-CM

## 2020-06-13 ENCOUNTER — Other Ambulatory Visit: Payer: Self-pay

## 2020-06-13 ENCOUNTER — Encounter (HOSPITAL_COMMUNITY): Payer: Self-pay | Admitting: Emergency Medicine

## 2020-06-13 ENCOUNTER — Emergency Department (HOSPITAL_COMMUNITY)
Admission: EM | Admit: 2020-06-13 | Discharge: 2020-06-14 | Disposition: A | Discharge status: Home / Self Care | Payer: No Typology Code available for payment source | Attending: Emergency Medicine | Admitting: Emergency Medicine

## 2020-06-13 DIAGNOSIS — T2601XA Burn of right eyelid and periocular area, initial encounter: Secondary | ICD-10-CM | POA: Diagnosis not present

## 2020-06-13 DIAGNOSIS — J45909 Unspecified asthma, uncomplicated: Secondary | ICD-10-CM | POA: Insufficient documentation

## 2020-06-13 DIAGNOSIS — T2026XA Burn of second degree of forehead and cheek, initial encounter: Secondary | ICD-10-CM | POA: Diagnosis not present

## 2020-06-13 DIAGNOSIS — T2020XA Burn of second degree of head, face, and neck, unspecified site, initial encounter: Secondary | ICD-10-CM

## 2020-06-13 DIAGNOSIS — X110XXA Contact with hot water in bath or tub, initial encounter: Secondary | ICD-10-CM | POA: Diagnosis not present

## 2020-06-13 DIAGNOSIS — T310 Burns involving less than 10% of body surface: Secondary | ICD-10-CM | POA: Insufficient documentation

## 2020-06-13 DIAGNOSIS — Z7982 Long term (current) use of aspirin: Secondary | ICD-10-CM | POA: Insufficient documentation

## 2020-06-13 DIAGNOSIS — T2602XA Burn of left eyelid and periocular area, initial encounter: Secondary | ICD-10-CM | POA: Insufficient documentation

## 2020-06-13 MED ORDER — BACITRACIN ZINC 500 UNIT/GM EX OINT
TOPICAL_OINTMENT | Freq: Two times a day (BID) | CUTANEOUS | Status: DC
  Filled 2020-06-13: qty 1.8

## 2020-06-13 MED ORDER — OXYCODONE-ACETAMINOPHEN 5-325 MG PO TABS
1.0000 | ORAL_TABLET | Freq: Once | ORAL | Status: AC
  Administered 2020-06-13: 1 via ORAL
  Filled 2020-06-13: qty 1
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
## 2020-08-26 ENCOUNTER — Other Ambulatory Visit: Payer: Self-pay | Admitting: Otolaryngology

## 2020-08-26 DIAGNOSIS — H9312 Tinnitus, left ear: Secondary | ICD-10-CM

## 2020-09-05 ENCOUNTER — Other Ambulatory Visit: Payer: Self-pay

## 2020-09-05 ENCOUNTER — Ambulatory Visit
Admission: RE | Admit: 2020-09-05 | Discharge: 2020-09-05 | Disposition: A | Discharge status: Home / Self Care | Payer: No Typology Code available for payment source | Source: Ambulatory Visit | Attending: Otolaryngology | Admitting: Otolaryngology

## 2020-09-05 DIAGNOSIS — H9312 Tinnitus, left ear: Secondary | ICD-10-CM | POA: Diagnosis not present

## 2020-09-05 MED ORDER — GADOBUTROL 1 MMOL/ML IV SOLN
9.0000 mL | Freq: Once | INTRAVENOUS | Status: AC | PRN
  Administered 2020-09-05: 9 mL via INTRAVENOUS

## 2020-09-19 ENCOUNTER — Encounter: Payer: No Typology Code available for payment source | Admitting: Family Medicine

## 2020-10-17 ENCOUNTER — Encounter: Payer: No Typology Code available for payment source | Admitting: Physician Assistant

## 2020-11-06 ENCOUNTER — Encounter: Payer: No Typology Code available for payment source | Admitting: Family Medicine

## 2020-12-30 ENCOUNTER — Other Ambulatory Visit: Payer: Self-pay

## 2020-12-30 ENCOUNTER — Ambulatory Visit (INDEPENDENT_AMBULATORY_CARE_PROVIDER_SITE_OTHER): Payer: No Typology Code available for payment source | Admitting: Family Medicine

## 2020-12-30 ENCOUNTER — Encounter: Payer: Self-pay | Admitting: Family Medicine

## 2020-12-30 VITALS — BP 137/92 | HR 76 | Temp 98.2°F | Resp 18 | Ht 70.0 in | Wt 200.0 lb

## 2020-12-30 DIAGNOSIS — Z1159 Encounter for screening for other viral diseases: Secondary | ICD-10-CM | POA: Diagnosis not present

## 2020-12-30 DIAGNOSIS — Z0000 Encounter for general adult medical examination without abnormal findings: Secondary | ICD-10-CM | POA: Diagnosis not present

## 2020-12-30 DIAGNOSIS — R739 Hyperglycemia, unspecified: Secondary | ICD-10-CM

## 2020-12-30 DIAGNOSIS — Z125 Encounter for screening for malignant neoplasm of prostate: Secondary | ICD-10-CM | POA: Diagnosis not present

## 2020-12-30 NOTE — Patient Instructions (Addendum)
Please review the attached list of medications and notify my office if there are any errors.   Please bring all of your medications to every appointment so we can make sure that our medication list is the same as yours.   I strongly recommend a Covid Bivalent Omicron booster if it's been more than 2 months since your last covid booster or infection.

## 2020-12-30 NOTE — Progress Notes (Signed)
Complete physical exam   Patient: [name]   DOB: Feb 07, 1961   60 y.o. Male  MRN: [id number] Visit Date: 12/30/2020  Today's healthcare provider: [name], MD   Chief Complaint  Patient presents with   Annual Exam   Subjective    [name] is a 60 y.o. male who presents today for a complete physical exam.  He reports consuming a general diet. Gym/ health club routine includes cardio and mod to heavy weightlifting. He generally feels fairly well. He reports sleeping fairly well. He does not have additional problems to discuss today.    Past Medical History:  Diagnosis Date   Asthma    Atrial flutter [facility])    Past Surgical History:  Procedure Laterality Date   BACK SURGERY     COLONOSCOPY WITH PROPOFOL N/A 02/08/2019   Procedure: COLONOSCOPY WITH PROPOFOL;  Surgeon: Wyline Mood, MD;  Location: [facility] ENDOSCOPY;  Service: Gastroenterology;  Laterality: N/A;   ELECTROPHYSIOLOGIC STUDY     atrial flutter   hernia (other)  1980s   lumbar back surgery  1999   Social History   Socioeconomic History   Marital status: Married    Spouse name: Not on file   Number of children: Not on file   Years of education: Not on file   Highest education level: Not on file  Occupational History   Not on file  Tobacco Use   Smoking status: Never   Smokeless tobacco: Never  Vaping Use   Vaping Use: Never used  Substance and Sexual Activity   Alcohol use: No   Drug use: No   Sexual activity: Not on file  Other Topics Concern   Not on file  Social History Narrative   Married. 4 children. Works as a Engineer, civil (consulting).    Social Determinants of Health   Financial Resource Strain: Not on file  Food Insecurity: Not on file  Transportation Needs: Not on file  Physical Activity: Not on file  Stress: Not on file  Social Connections: Not on file  Intimate Partner Violence: Not on file   Family Status  Relation Name Status   Mother  Alive   Father  Deceased   Sister   Deceased   Brother  Alive   Sister  Alive   Sister  Alive   Sister  Alive   Family History  Problem Relation Age of Onset   Diabetes Mother    Hyperlipidemia Mother    Glaucoma Mother    Emphysema Father    Glaucoma Father        open-angle   Asthma Sister    No Known Allergies  Patient Care Team: Maryella Shivers as PCP - General (Physician Assistant)   Medications: Outpatient Medications Prior to Visit  Medication Sig   aspirin 81 MG EC tablet Take 81 mg by mouth daily.   Cholecalciferol (VITAMIN D) 2000 units CAPS Take 4,000 Units by mouth daily.   Multiple Vitamins-Minerals (DAILY MULTI) TABS Take by mouth daily.   [DISCONTINUED] ciprofloxacin-dexamethasone (CIPRODEX) OTIC suspension Place 4 drops into the left ear 2 (two) times daily. (Patient not taking: Reported on 12/30/2020)   [DISCONTINUED] KRILL OIL PO Take by mouth. (Patient not taking: Reported on 04/22/2020)   [DISCONTINUED] Turmeric 500 MG CAPS Take 1,000 mg by mouth daily. (Patient not taking: No sig reported)   No facility-administered medications prior to visit.    Review of Systems  Constitutional:  Negative for appetite change, chills, fatigue  and fever.  HENT:  Negative for congestion, ear pain, hearing loss, nosebleeds and trouble swallowing.   Eyes:  Negative for pain and visual disturbance.  Respiratory:  Negative for cough, chest tightness and shortness of breath.   Cardiovascular:  Negative for chest pain, palpitations and leg swelling.  Gastrointestinal:  Negative for abdominal pain, blood in stool, constipation, diarrhea, nausea and vomiting.  Endocrine: Negative for polydipsia, polyphagia and polyuria.  Genitourinary:  Negative for dysuria and flank pain.  Musculoskeletal:  Negative for arthralgias, back pain, joint swelling, myalgias and neck stiffness.  Skin:  Negative for color change, rash and wound.  Neurological:  Negative for dizziness, tremors, seizures, speech difficulty,  weakness, light-headedness and headaches.  Psychiatric/Behavioral:  Negative for behavioral problems, confusion, decreased concentration, dysphoric mood and sleep disturbance. The patient is not nervous/anxious.   All other systems reviewed and are negative.    Objective    BP (!) 137/92 (BP Location: Right Arm, Patient Position: Sitting, Cuff Size: Large)   Pulse 76   Temp 98.2 F (36.8 C) (Oral)   Resp 18   Ht 5\' 10"  (1.778 m)   Wt 200 lb (90.7 kg)   SpO2 99% Comment: room air  BMI 28.70 kg/m    Physical Exam   General Appearance:     Well developed, well nourished male. Alert, cooperative, in no acute distress, appears stated age  Head:    Normocephalic, without obvious abnormality, atraumatic  Eyes:    PERRL, conjunctiva/corneas clear, EOM's intact, fundi    benign, both eyes       Ears:    Normal TM's and external ear canals, both ears  Neck:   Supple, symmetrical, trachea midline, no adenopathy;       thyroid:  No enlargement/tenderness/nodules; no carotid   bruit or JVD  Back:     Symmetric, no curvature, ROM normal, no CVA tenderness  Lungs:     Clear to auscultation bilaterally, respirations unlabored  Chest wall:    No tenderness or deformity  Heart:    Normal heart rate. Normal rhythm. No murmurs, rubs, or gallops.  S1 and S2 normal  Abdomen:     Soft, non-tender, bowel sounds active all four quadrants,    no masses, no organomegaly  Genitalia:    deferred  Rectal:    deferred  Extremities:   All extremities are intact. No cyanosis or edema  Pulses:   2+ and symmetric all extremities  Skin:   Skin color, texture, turgor normal, no rashes or lesions  Lymph nodes:   Cervical, supraclavicular, and axillary nodes normal  Neurologic:   CNII-XII intact. Normal strength, sensation and reflexes      throughout     Last depression screening scores PHQ 2/9 Scores 12/30/2020 04/22/2020 10/17/2019  PHQ - 2 Score 0 0 0  PHQ- 9 Score 0 0 0   Last fall risk screening Fall  Risk  12/30/2020  Falls in the past year? 0  Number falls in past yr: 0  Injury with Fall? 0  Risk for fall due to : No Fall Risks  Follow up Falls evaluation completed   Last Audit-C alcohol use screening Alcohol Use Disorder Test (AUDIT) 12/30/2020  1. How often do you have a drink containing alcohol? 0  2. How many drinks containing alcohol do you have on a typical day when you are drinking? 0  3. How often do you have six or more drinks on one occasion? 0  AUDIT-C Score 0  Alcohol Brief Interventions/Follow-up -   A score of 3 or more in women, and 4 or more in men indicates increased risk for alcohol abuse, EXCEPT if all of the points are from question 1   No results found for any visits on 12/30/20.  Assessment & Plan    Routine Health Maintenance and Physical Exam  Exercise Activities and Dietary recommendations  Goals   None     Immunization History  Administered Date(s) Administered   Influenza,inj,Quad PF,6+ Mos 11/14/2018, 12/19/2020   Td 05/28/1996, 10/16/2009, 09/02/2017   Tdap 09/24/2005, 08/05/2018    Health Maintenance  Topic Date Due   Hepatitis C Screening  Never done   Zoster Vaccines- Shingrix (1 of 2) Never done   TETANUS/TDAP  08/04/2028   COLONOSCOPY (Pts 45-23yrs Insurance coverage will need to be confirmed)  02/07/2029   INFLUENZA VACCINE  Completed   HIV Screening  Completed   HPV VACCINES  Aged Out    Discussed health benefits of physical activity, and encouraged him to engage in regular exercise appropriate for his age and condition.  - CBC - Comprehensive metabolic panel - Lipid panel  He is noted to have mildly elevated blood pressure in office, but reports blood pressures taken at work are usually in the 130s/80s. Encourage to check BP while relaxed at home about once a week and let me if consistently above 140/90. He does admit to poor diet habits and will work on eating healthier. He already exercises regularly.   2.  Hyperglycemia He does have family history of diabetes.  - Hemoglobin A1c  3. Need for hepatitis C screening test  - Hepatitis C antibody  4. Prostate cancer screening  - PSA Total (Reflex To Free) ([facility] only)        The entirety of the information documented in the History of Present Illness, Review of Systems and Physical Exam were personally obtained by me. Portions of this information were initially documented by the CMA and reviewed by me for thoroughness and accuracy.     [name], MD  [facility] [phone] (phone) [phone] (fax)  [facility]

## 2020-12-31 LAB — COMPREHENSIVE METABOLIC PANEL
ALT: 44 IU/L (ref 0–44)
AST: 38 IU/L (ref 0–40)
Albumin/Globulin Ratio: 1.7 (ref 1.2–2.2)
Albumin: 4.3 g/dL (ref 3.8–4.8)
Alkaline Phosphatase: 71 IU/L (ref 44–121)
BUN/Creatinine Ratio: 9 — ABNORMAL LOW (ref 10–24)
BUN: 10 mg/dL (ref 8–27)
Bilirubin Total: 0.9 mg/dL (ref 0.0–1.2)
CO2: 23 mmol/L (ref 20–29)
Calcium: 9 mg/dL (ref 8.6–10.2)
Chloride: 102 mmol/L (ref 96–106)
Creatinine, Ser: 1.07 mg/dL (ref 0.76–1.27)
Globulin, Total: 2.6 g/dL (ref 1.5–4.5)
Glucose: 83 mg/dL (ref 70–99)
Potassium: 3.9 mmol/L (ref 3.5–5.2)
Sodium: 140 mmol/L (ref 134–144)
Total Protein: 6.9 g/dL (ref 6.0–8.5)
eGFR: 78 mL/min/{1.73_m2} (ref >59)

## 2020-12-31 LAB — LIPID PANEL
Chol/HDL Ratio: 2.2 ratio (ref 0.0–5.0)
Cholesterol, Total: 153 mg/dL (ref 100–199)
HDL: 69 mg/dL (ref >39)
LDL Chol Calc (NIH): 63 mg/dL (ref 0–99)
Triglycerides: 122 mg/dL (ref 0–149)
VLDL Cholesterol Cal: 21 mg/dL (ref 5–40)

## 2020-12-31 LAB — CBC
Hematocrit: 39.7 % (ref 37.5–51.0)
Hemoglobin: 13.4 g/dL (ref 13.0–17.7)
MCH: 31.7 pg (ref 26.6–33.0)
MCHC: 33.8 g/dL (ref 31.5–35.7)
MCV: 94 fL (ref 79–97)
Platelets: 197 10*3/uL (ref 150–450)
RBC: 4.23 x10E6/uL (ref 4.14–5.80)
RDW: 12.3 % (ref 11.6–15.4)
WBC: 4.8 10*3/uL (ref 3.4–10.8)

## 2020-12-31 LAB — PSA TOTAL (REFLEX TO FREE): Prostate Specific Ag, Serum: 1.5 ng/mL (ref 0.0–4.0)

## 2020-12-31 LAB — HEMOGLOBIN A1C
Est. average glucose Bld gHb Est-mCnc: 103 mg/dL
Hgb A1c MFr Bld: 5.2 % (ref 4.8–5.6)

## 2020-12-31 LAB — HEPATITIS C ANTIBODY: Hep C Virus Ab: <0.1 s/co ratio (ref 0.0–0.9)

## 2021-01-06 ENCOUNTER — Telehealth: Payer: Self-pay

## 2021-01-06 MED ORDER — BENZONATATE 200 MG PO CAPS: 200.0000 mg | ORAL_CAPSULE | Freq: Two times a day (BID) | ORAL | 0 refills | Status: AC | PRN

## 2021-01-06 NOTE — Addendum Note (Signed)
Addended by: Malva Limes on: 01/06/2021 03:46 PM   Modules accepted: Orders

## 2021-01-06 NOTE — Telephone Encounter (Signed)
Copied from CRM [id number]. Topic: General - Other >> Jan 06, 2021  3:03 PM Traci Sermon wrote: Reason for CRM: Pt called in stating he believes he tested positive for covid right after seeing him on his last appt, and stated he wanted to see about getting a "tessalon pearls" medication, please advise.

## 2021-12-29 ENCOUNTER — Ambulatory Visit (INDEPENDENT_AMBULATORY_CARE_PROVIDER_SITE_OTHER): Payer: No Typology Code available for payment source | Admitting: Family Medicine

## 2021-12-29 ENCOUNTER — Ambulatory Visit
Admission: RE | Admit: 2021-12-29 | Discharge: 2021-12-29 | Disposition: A | Discharge status: Home / Self Care | Payer: No Typology Code available for payment source | Attending: Family Medicine | Admitting: Family Medicine

## 2021-12-29 ENCOUNTER — Encounter: Payer: Self-pay | Admitting: Family Medicine

## 2021-12-29 ENCOUNTER — Ambulatory Visit
Admission: RE | Admit: 2021-12-29 | Discharge: 2021-12-29 | Disposition: A | Discharge status: Home / Self Care | Payer: No Typology Code available for payment source | Source: Ambulatory Visit | Attending: Family Medicine | Admitting: Family Medicine

## 2021-12-29 VITALS — BP 147/87 | HR 79 | Resp 16 | Ht 70.0 in | Wt 185.0 lb

## 2021-12-29 DIAGNOSIS — M7741 Metatarsalgia, right foot: Secondary | ICD-10-CM | POA: Diagnosis present

## 2021-12-29 DIAGNOSIS — M19071 Primary osteoarthritis, right ankle and foot: Secondary | ICD-10-CM | POA: Diagnosis not present

## 2021-12-29 DIAGNOSIS — R3911 Hesitancy of micturition: Secondary | ICD-10-CM | POA: Diagnosis not present

## 2021-12-29 DIAGNOSIS — Z125 Encounter for screening for malignant neoplasm of prostate: Secondary | ICD-10-CM | POA: Diagnosis not present

## 2021-12-29 DIAGNOSIS — Z23 Encounter for immunization: Secondary | ICD-10-CM

## 2021-12-29 DIAGNOSIS — Z13228 Encounter for screening for other metabolic disorders: Secondary | ICD-10-CM

## 2021-12-29 DIAGNOSIS — R351 Nocturia: Secondary | ICD-10-CM | POA: Diagnosis not present

## 2021-12-29 DIAGNOSIS — Z13220 Encounter for screening for lipoid disorders: Secondary | ICD-10-CM

## 2021-12-29 NOTE — Patient Instructions (Signed)
.   Please review the attached list of medications and notify my office if there are any errors.   . Please bring all of your medications to every appointment so we can make sure that our medication list is the same as yours.   

## 2021-12-29 NOTE — Progress Notes (Signed)
     I,Tiffany J Bragg,acting as a scribe for [name], MD.,have documented all relevant documentation on the behalf of [name], MD,as directed by  [name], MD while in the presence of [name], MD.   Established patient visit   Patient: [name]   DOB: November 03, 1960   61 y.o. Male  MRN: [id number] Visit Date: 12/29/2021  Today's healthcare provider: [name], MD   Chief Complaint  Patient presents with   Pain    Patient complains of R foot pain starting 2 weeks ago.    Subjective    HPI Pain is on bottom of foot between second and third metatarsals. No known injuries. Worse when stepping without shoes. Not taking any medications. Does not do high impact exercises but uses stair stepper.   He also reports he is having to get up an night more frequently to urinate getting worse over the last few years and urine stream is not very strong. No dysuria.   Medications: Outpatient Medications Prior to Visit  Medication Sig   aspirin 81 MG EC tablet Take 81 mg by mouth daily.   Cholecalciferol (VITAMIN D) 2000 units CAPS Take 4,000 Units by mouth daily.   Multiple Vitamins-Minerals (DAILY MULTI) TABS Take by mouth daily.   No facility-administered medications prior to visit.         Objective    BP (!) 147/87 (BP Location: Left Arm, Patient Position: Sitting, Cuff Size: Large)   Pulse 79   Resp 16   Ht 5\' 10"  (1.778 m)   Wt 185 lb (83.9 kg)   SpO2 99%   BMI 26.54 kg/m    Physical Exam  Tender firm nodule plantar foot over third metatarsal.   Assessment & Plan     1. Metatarsalgia of right foot  - DG Foot Complete Right; Future  2. Nocturia   3. Urinary hesitancy Advised that would consider alpha blocker if PSA is normal, however he doesn't feel he needs medication yet.   4. Need for influenza vaccination  - Flu Vaccine QUAD 6+ mos PF IM (Fluarix Quad PF)   5. Prostate cancer screening  - PSA Total (Reflex To Free)  He is  coming in for CPE later this month and given labs order to have blood drawn prior to appointment - Lipid panel - CBC - Comprehensive metabolic panel      The entirety of the information documented in the History of Present Illness, Review of Systems and Physical Exam were personally obtained by me. Portions of this information were initially documented by the CMA and reviewed by me for thoroughness and accuracy.     [name], MD  [facility] [phone] (phone) [phone] (fax)  [place]

## 2022-01-06 LAB — CBC
Hematocrit: 42.8 % (ref 37.5–51.0)
Hemoglobin: 14.3 g/dL (ref 13.0–17.7)
MCH: 31.5 pg (ref 26.6–33.0)
MCHC: 33.4 g/dL (ref 31.5–35.7)
MCV: 94 fL (ref 79–97)
Platelets: 222 10*3/uL (ref 150–450)
RBC: 4.54 x10E6/uL (ref 4.14–5.80)
RDW: 12 % (ref 11.6–15.4)
WBC: 4.3 10*3/uL (ref 3.4–10.8)

## 2022-01-06 LAB — COMPREHENSIVE METABOLIC PANEL
ALT: 32 IU/L (ref 0–44)
AST: 35 IU/L (ref 0–40)
Albumin/Globulin Ratio: 1.8 (ref 1.2–2.2)
Albumin: 4.4 g/dL (ref 3.9–4.9)
Alkaline Phosphatase: 71 IU/L (ref 44–121)
BUN/Creatinine Ratio: 11 (ref 10–24)
BUN: 13 mg/dL (ref 8–27)
Bilirubin Total: 0.8 mg/dL (ref 0.0–1.2)
CO2: 22 mmol/L (ref 20–29)
Calcium: 9.3 mg/dL (ref 8.6–10.2)
Chloride: 103 mmol/L (ref 96–106)
Creatinine, Ser: 1.17 mg/dL (ref 0.76–1.27)
Globulin, Total: 2.5 g/dL (ref 1.5–4.5)
Glucose: 95 mg/dL (ref 70–99)
Potassium: 4.5 mmol/L (ref 3.5–5.2)
Sodium: 140 mmol/L (ref 134–144)
Total Protein: 6.9 g/dL (ref 6.0–8.5)
eGFR: 70 mL/min/{1.73_m2} (ref >59)

## 2022-01-06 LAB — LIPID PANEL
Chol/HDL Ratio: 2.4 ratio (ref 0.0–5.0)
Cholesterol, Total: 159 mg/dL (ref 100–199)
HDL: 66 mg/dL (ref >39)
LDL Chol Calc (NIH): 83 mg/dL (ref 0–99)
Triglycerides: 46 mg/dL (ref 0–149)
VLDL Cholesterol Cal: 10 mg/dL (ref 5–40)

## 2022-01-06 LAB — PSA TOTAL (REFLEX TO FREE): Prostate Specific Ag, Serum: 1.6 ng/mL (ref 0.0–4.0)

## 2022-01-08 ENCOUNTER — Encounter: Payer: Self-pay | Admitting: Family Medicine

## 2022-01-08 ENCOUNTER — Ambulatory Visit (INDEPENDENT_AMBULATORY_CARE_PROVIDER_SITE_OTHER): Payer: No Typology Code available for payment source | Admitting: Family Medicine

## 2022-01-08 VITALS — BP 135/87 | HR 62 | Temp 97.8°F | Resp 16 | Ht 70.0 in | Wt 190.0 lb

## 2022-01-08 DIAGNOSIS — Z0000 Encounter for general adult medical examination without abnormal findings: Secondary | ICD-10-CM | POA: Diagnosis not present

## 2022-01-08 DIAGNOSIS — Z23 Encounter for immunization: Secondary | ICD-10-CM

## 2022-01-08 DIAGNOSIS — M7741 Metatarsalgia, right foot: Secondary | ICD-10-CM | POA: Diagnosis not present

## 2022-01-08 NOTE — Progress Notes (Signed)
I,[name],acting as a scribe for Brad Huh, MD.,have documented all relevant documentation on the behalf of Brad Huh, MD,as directed by  Brad Huh, MD while in the presence of Brad Huh, MD.    Complete physical exam   Patient: [name]   DOB: 1960/11/01   61 y.o. Male  MRN: [id number] Visit Date: 01/08/2022  Today's healthcare provider: [name], MD   Chief Complaint  Patient presents with   Annual Exam   Subjective    [name] is a 61 y.o. male who presents today for a complete physical exam.  He reports consuming a general diet. Home exercise routine includes weight lifting and cardio at least 2 times per week. He generally feels fairly well. He reports sleeping fairly well. He does not have additional problems to discuss today.  HPI    Past Medical History:  Diagnosis Date   Asthma    Atrial flutter [facility])    Past Surgical History:  Procedure Laterality Date   BACK SURGERY     COLONOSCOPY WITH PROPOFOL N/A 02/08/2019   Procedure: COLONOSCOPY WITH PROPOFOL;  Surgeon: Brad Bellows, MD;  Location: [facility] ENDOSCOPY;  Service: Gastroenterology;  Laterality: N/A;   ELECTROPHYSIOLOGIC STUDY     atrial flutter   hernia (other)  1980s   lumbar back surgery  1999   Social History   Socioeconomic History   Marital status: Married    Spouse name: Not on file   Number of children: Not on file   Years of education: Not on file   Highest education level: Not on file  Occupational History   Not on file  Tobacco Use   Smoking status: Never   Smokeless tobacco: Never  Vaping Use   Vaping Use: Never used  Substance and Sexual Activity   Alcohol use: No   Drug use: No   Sexual activity: Not on file  Other Topics Concern   Not on file  Social History Narrative   Married. 4 children. Works as a Marine scientist.    Social Determinants of Health   Financial Resource Strain: Not on file  Food Insecurity: Not on file  Transportation  Needs: Not on file  Physical Activity: Not on file  Stress: Not on file  Social Connections: Not on file  Intimate Partner Violence: Not on file   Family Status  Relation Name Status   Mother  Alive   Father  Deceased   Sister  Deceased   Brother  Alive   Sister  Alive   Sister  Alive   Sister  Alive   Family History  Problem Relation Age of Onset   Diabetes Mother    Hyperlipidemia Mother    Glaucoma Mother    Emphysema Father    Glaucoma Father        open-angle   Asthma Sister    No Known Allergies  Patient Care Team: Terrilee Croak, Adriana M, PA-C (Inactive) as PCP - General (Physician Assistant)   Medications: Outpatient Medications Prior to Visit  Medication Sig   aspirin 81 MG chewable tablet Chew 81 mg by mouth daily.   Black Pepper-Turmeric (TURMERIC CURCUMIN) 07-998 MG CAPS Take by mouth.   Cholecalciferol (VITAMIN D) 2000 units CAPS Take 4,000 Units by mouth daily.   Multiple Vitamins-Minerals (DAILY MULTI) TABS Take by mouth daily.   [DISCONTINUED] aspirin 81 MG EC tablet Take 81 mg by mouth daily. (Patient not taking: Reported on 01/08/2022)   No facility-administered medications prior  to visit.    Review of Systems  Constitutional:  Negative for appetite change, chills, fatigue and fever.  HENT:  Negative for congestion, ear pain, hearing loss, nosebleeds and trouble swallowing.   Eyes:  Negative for pain and visual disturbance.  Respiratory:  Negative for cough, chest tightness, shortness of breath and wheezing.   Cardiovascular:  Negative for chest pain, palpitations and leg swelling.  Gastrointestinal:  Negative for abdominal pain, blood in stool, constipation, diarrhea, nausea and vomiting.  Endocrine: Negative for polydipsia, polyphagia and polyuria.  Genitourinary:  Negative for dysuria and flank pain.  Musculoskeletal:  Negative for arthralgias, back pain, joint swelling, myalgias and neck stiffness.  Skin:  Negative for color change, rash and wound.   Neurological:  Negative for dizziness, tremors, seizures, speech difficulty, weakness, light-headedness and headaches.  Psychiatric/Behavioral:  Negative for behavioral problems, confusion, decreased concentration, dysphoric mood and sleep disturbance. The patient is not nervous/anxious.   All other systems reviewed and are negative.  Lab Results  Component Value Date   PSA1 1.6 01/05/2022   PSA1 1.5 12/30/2020   PSA1 1.2 09/02/2017   Last CBC Lab Results  Component Value Date   WBC 4.3 01/05/2022   HGB 14.3 01/05/2022   HCT 42.8 01/05/2022   MCV 94 01/05/2022   MCH 31.5 01/05/2022   RDW 12.0 01/05/2022   PLT 222 00/17/4944   Last metabolic panel Lab Results  Component Value Date   GLUCOSE 95 01/05/2022   NA 140 01/05/2022   K 4.5 01/05/2022   CL 103 01/05/2022   CO2 22 01/05/2022   BUN 13 01/05/2022   CREATININE 1.17 01/05/2022   EGFR 70 01/05/2022   CALCIUM 9.3 01/05/2022   PHOS 3.2 03/06/2015   PROT 6.9 01/05/2022   ALBUMIN 4.4 01/05/2022   LABGLOB 2.5 01/05/2022   AGRATIO 1.8 01/05/2022   BILITOT 0.8 01/05/2022   ALKPHOS 71 01/05/2022   AST 35 01/05/2022   ALT 32 01/05/2022   ANIONGAP 8 08/07/2018   Last lipids Lab Results  Component Value Date   CHOL 159 01/05/2022   HDL 66 01/05/2022   LDLCALC 83 01/05/2022   TRIG 46 01/05/2022   CHOLHDL 2.4 01/05/2022      Objective    BP 135/87 (BP Location: Left Arm, Patient Position: Sitting, Cuff Size: Large)   Pulse 62   Temp 97.8 F (36.6 C) (Oral)   Resp 16   Ht '5\' 10"'  (1.778 m)   Wt 190 lb (86.2 kg)   SpO2 99% Comment: room air  BMI 27.26 kg/m     Physical Exam   General Appearance:    Well developed, well nourished male. Alert, cooperative, in no acute distress, appears stated age  Head:    Normocephalic, without obvious abnormality, atraumatic  Eyes:    PERRL, conjunctiva/corneas clear, EOM's intact, fundi    benign, both eyes       Ears:    Normal TM's and external ear canals, both ears   Nose:   Nares normal, septum midline, mucosa normal, no drainage   or sinus tenderness  Throat:   Lips, mucosa, and tongue normal; teeth and gums normal  Neck:   Supple, symmetrical, trachea midline, no adenopathy;       thyroid:  No enlargement/tenderness/nodules; no carotid   bruit or JVD  Back:     Symmetric, no curvature, ROM normal, no CVA tenderness  Lungs:     Clear to auscultation bilaterally, respirations unlabored  Chest wall:  No tenderness or deformity  Heart:    Normal heart rate. Normal rhythm. No murmurs, rubs, or gallops.  S1 and S2 normal  Abdomen:     Soft, non-tender, bowel sounds active all four quadrants,    no masses, no organomegaly  Genitalia:    deferred  Rectal:    deferred  Extremities:   All extremities are intact. No cyanosis or edema  Pulses:   2+ and symmetric all extremities  Skin:   Skin color, texture, turgor normal, no rashes or lesions  Lymph nodes:   Cervical, supraclavicular, and axillary nodes normal  Neurologic:   CNII-XII intact. Normal strength, sensation and reflexes      throughout     Last depression screening scores    01/08/2022   10:16 AM 12/30/2020    2:32 PM 04/22/2020    8:31 AM  PHQ 2/9 Scores  PHQ - 2 Score 0 0 0  PHQ- 9 Score 0 0 0   Last fall risk screening    01/08/2022   10:16 AM  [place] in the past year? 0  Number falls in past yr: 0  Injury with Fall? 0  Risk for fall due to : No Fall Risks  Follow up Falls evaluation completed   Last Audit-C alcohol use screening    01/08/2022   10:16 AM  Alcohol Use Disorder Test (AUDIT)  1. How often do you have a drink containing alcohol? 0  2. How many drinks containing alcohol do you have on a typical day when you are drinking? 0  3. How often do you have six or more drinks on one occasion? 0  AUDIT-C Score 0   A score of 3 or more in women, and 4 or more in men indicates increased risk for alcohol abuse, EXCEPT if all of the points are from question 1    No results found for any visits on 01/08/22.  Assessment & Plan    Routine Health Maintenance and Physical Exam  Exercise Activities and Dietary recommendations  Goals   None     Immunization History  Administered Date(s) Administered   Influenza,inj,Quad PF,6+ Mos 11/14/2018, 12/19/2020, 12/29/2021   Td 05/28/1996, 10/16/2009, 09/02/2017   Tdap 09/24/2005, 08/05/2018    Health Maintenance  Topic Date Due   Zoster Vaccines- Shingrix (1 of 2) Never done   TETANUS/TDAP  08/04/2028   COLONOSCOPY (Pts 45-75yr Insurance coverage will need to be confirmed)  02/07/2029   INFLUENZA VACCINE  Completed   Hepatitis C Screening  Completed   HIV Screening  Completed   HPV VACCINES  Aged Out    Discussed health benefits of physical activity, and encouraged him to engage in regular exercise appropriate for his age and condition.   2. Need for shingles vaccine  - Administer Zoster, Recombinant (Shingrix) Vaccine  3. Metatarsalgia of right foot Is using metatarsal pads. If not resolving in 4-6 weeks will refer to podiatry.      The entirety of the information documented in the History of Present Illness, Review of Systems and Physical Exam were personally obtained by me. Portions of this information were initially documented by the CMA and reviewed by me for thoroughness and accuracy.     DLelon Huh MD  [phone](phone) [phone](fax)  [place]

## 2022-03-04 NOTE — Progress Notes (Signed)
     I,[name],acting as a Neurosurgeon for Brad Merry, MD.,have documented all relevant documentation on the behalf of Brad Merry, MD,as directed by  Brad Merry, MD while in the presence of Brad Merry, MD.   Established patient visit   Patient: [name]   DOB: 1960/11/05   61 y.o. Male  MRN: [id number] Visit Date: 03/05/2022  Today's healthcare provider: [name], MD   No chief complaint on file.  Subjective    HPI  Patient is here to discuss referral to urology. Patient states he previously saw Dr. Evelene Croon years ago with elevated PSA. He had some type of procedure and normal prostate biopsy at that time. Now he states he is having urinary hesitancy and nocturia and would like to see him again if he could. Lab Results  Component Value Date   PSA1 1.6 01/05/2022   PSA1 1.5 12/30/2020   PSA1 1.2 09/02/2017     Medications: Outpatient Medications Prior to Visit  Medication Sig   aspirin 81 MG chewable tablet Chew 81 mg by mouth daily.   Black Pepper-Turmeric (TURMERIC CURCUMIN) 07-998 MG CAPS Take by mouth.   Cholecalciferol (VITAMIN D) 2000 units CAPS Take 4,000 Units by mouth daily.   Multiple Vitamins-Minerals (DAILY MULTI) TABS Take by mouth daily.   No facility-administered medications prior to visit.    Review of Systems  Constitutional:  Negative for appetite change, chills and fever.  Respiratory:  Negative for chest tightness, shortness of breath and wheezing.   Cardiovascular:  Negative for chest pain and palpitations.  Gastrointestinal:  Negative for abdominal pain, nausea and vomiting.       Objective    BP (!) 160/89 (BP Location: Left Arm, Patient Position: Sitting, Cuff Size: Large)   Pulse 62   Ht 5\' 10"  (1.778 m)   Wt 190 lb (86.2 kg)   SpO2 98%   BMI 27.26 kg/m    Physical Exam   General appearance: Well developed, well nourished male, cooperative and in no acute distress Head: Normocephalic, without obvious  abnormality, atraumatic Respiratory: Respirations even and unlabored, normal respiratory rate Extremities: All extremities are intact.  Skin: Skin color, texture, turgor normal. No rashes seen  Psych: Appropriate mood and affect. Neurologic: Mental status: Alert, oriented to person, place, and time, thought content appropriate.    Assessment & Plan     1. Urinary hesitancy  2. Nocturia  Recent PSA was normal. Had negative prostates biopsy by Dr. several years ago.   Will try - tamsulosin (FLOMAX) 0.4 MG CAPS capsule; Take 1 capsule (0.4 mg total) by mouth daily.  Dispense: 30 capsule; Refill: 2   Counseled on potential adverse reactions.  Advised to call or send me a message for urology referral if he doesn't notice significantly improvement within 2 weeks.      The entirety of the information documented in the History of Present Illness, Review of Systems and Physical Exam were personally obtained by me. Portions of this information were initially documented by the CMA and reviewed by me for thoroughness and accuracy.     Sheppard Penton, MD  [facility] [phone] (phone) [phone] (fax)  [facility]

## 2022-03-05 ENCOUNTER — Ambulatory Visit (INDEPENDENT_AMBULATORY_CARE_PROVIDER_SITE_OTHER): Payer: No Typology Code available for payment source | Admitting: Family Medicine

## 2022-03-05 ENCOUNTER — Encounter: Payer: Self-pay | Admitting: Family Medicine

## 2022-03-05 VITALS — BP 160/89 | HR 62 | Ht 70.0 in | Wt 190.0 lb

## 2022-03-05 DIAGNOSIS — R3911 Hesitancy of micturition: Secondary | ICD-10-CM

## 2022-03-05 DIAGNOSIS — R351 Nocturia: Secondary | ICD-10-CM | POA: Diagnosis not present

## 2022-03-05 MED ORDER — TAMSULOSIN HCL 0.4 MG PO CAPS: 0.4000 mg | ORAL_CAPSULE | Freq: Every day | ORAL | 2 refills | Status: DC

## 2022-03-12 ENCOUNTER — Ambulatory Visit: Payer: No Typology Code available for payment source | Admitting: Family Medicine

## 2022-04-12 ENCOUNTER — Ambulatory Visit (INDEPENDENT_AMBULATORY_CARE_PROVIDER_SITE_OTHER): Payer: 59 | Admitting: Family Medicine

## 2022-04-12 DIAGNOSIS — Z23 Encounter for immunization: Secondary | ICD-10-CM | POA: Diagnosis not present

## 2022-04-12 NOTE — Progress Notes (Signed)
Administered 2nd shingrix shot left deltoid and COVID 19 on the left deltoid. Pt tolerated well.

## 2022-06-01 DIAGNOSIS — H47239 Glaucomatous optic atrophy, unspecified eye: Secondary | ICD-10-CM | POA: Diagnosis not present

## 2022-06-01 DIAGNOSIS — H2513 Age-related nuclear cataract, bilateral: Secondary | ICD-10-CM | POA: Diagnosis not present

## 2022-06-01 DIAGNOSIS — H40003 Preglaucoma, unspecified, bilateral: Secondary | ICD-10-CM | POA: Diagnosis not present

## 2022-06-01 DIAGNOSIS — H47393 Other disorders of optic disc, bilateral: Secondary | ICD-10-CM | POA: Diagnosis not present

## 2022-10-20 ENCOUNTER — Encounter: Payer: Self-pay | Admitting: Family Medicine

## 2022-10-20 ENCOUNTER — Ambulatory Visit (INDEPENDENT_AMBULATORY_CARE_PROVIDER_SITE_OTHER): Payer: 59 | Admitting: Family Medicine

## 2022-10-20 VITALS — BP 122/89 | HR 94 | Ht 70.0 in | Wt 194.3 lb

## 2022-10-20 DIAGNOSIS — Z0000 Encounter for general adult medical examination without abnormal findings: Secondary | ICD-10-CM

## 2022-10-20 DIAGNOSIS — Z833 Family history of diabetes mellitus: Secondary | ICD-10-CM

## 2022-10-20 DIAGNOSIS — R739 Hyperglycemia, unspecified: Secondary | ICD-10-CM | POA: Diagnosis not present

## 2022-10-20 DIAGNOSIS — Z8249 Family history of ischemic heart disease and other diseases of the circulatory system: Secondary | ICD-10-CM

## 2022-10-20 DIAGNOSIS — R351 Nocturia: Secondary | ICD-10-CM | POA: Diagnosis not present

## 2022-10-20 DIAGNOSIS — I440 Atrioventricular block, first degree: Secondary | ICD-10-CM | POA: Diagnosis not present

## 2022-10-20 NOTE — Progress Notes (Signed)
Established patient visit   Patient: [name]   DOB: 02/06/1961   62 y.o. Male  MRN: [id number] Visit Date: 10/20/2022  Today's healthcare provider: [name], MD   Chief Complaint  Patient presents with   Annual Exam   Subjective    HPI  General doing well with no new complaints. Has a small dermoid cyst on lower arm. 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 Does have family history of diabetes in his mother, and suspected atrial-fib in his father who has passed away.   Medications: Outpatient Medications Prior to Visit  Medication Sig   aspirin 81 MG chewable tablet Chew 81 mg by mouth daily.   Black Pepper-Turmeric (TURMERIC CURCUMIN) 07-998 MG CAPS Take by mouth.   Cholecalciferol (VITAMIN D) 2000 units CAPS Take 4,000 Units by mouth daily.   Multiple Vitamins-Minerals (DAILY MULTI) TABS Take by mouth daily.   [DISCONTINUED] tamsulosin (FLOMAX) 0.4 MG CAPS capsule Take 1 capsule (0.4 mg total) by mouth daily.   No facility-administered medications prior to visit.    Review of Systems  Constitutional:  Negative for appetite change, chills and fever.  Respiratory:  Negative for chest tightness, shortness of breath and wheezing.   Cardiovascular:  Negative for chest pain and palpitations.  Gastrointestinal:  Negative for abdominal pain, nausea and vomiting.      Objective    BP 122/89 (BP Location: Left Arm, Patient Position: Sitting, Cuff Size: Large)   Pulse 94   Ht 5\' 10"  (1.778 m)   Wt 194 lb 4.8 oz (88.1 kg)   SpO2 99%   BMI 27.88 kg/m   Physical Exam   General Appearance:    Well developed, well nourished male. Alert, cooperative, in no acute distress, appears stated age  Head:    Normocephalic, without obvious abnormality, atraumatic  Eyes:    PERRL, conjunctiva/corneas clear, EOM's intact, fundi    benign, both eyes       Ears:    Normal TM's and external ear canals, both ears   Nose:   Nares normal, septum midline, mucosa normal, no drainage   or sinus tenderness  Throat:   Lips, mucosa, and tongue normal; teeth and gums normal  Neck:   Supple, symmetrical, trachea midline, no adenopathy;       thyroid:  No enlargement/tenderness/nodules; no carotid   bruit or JVD  Back:     Symmetric, no curvature, ROM normal, no CVA tenderness  Lungs:     Clear to auscultation bilaterally, respirations unlabored  Chest wall:    No tenderness or deformity  Heart:    Normal heart rate. Normal rhythm. No murmurs, rubs, or gallops.  S1 and S2 normal  Abdomen:     Soft, non-tender, bowel sounds active all four quadrants,    no masses, no organomegaly  Genitalia:    deferred  Rectal:    deferred  Extremities:   All extremities are intact. No cyanosis or edema  Pulses:   2+ and symmetric all extremities  Skin:   Skin color, texture, turgor normal, no rashes or lesions  Lymph nodes:   Cervical, supraclavicular, and axillary nodes normal  Neurologic:   CNII-XII intact. Normal strength, sensation and reflexes      throughout   EKG: PR=256, possible left atrial enlargement  Assessment & Plan     1. Annual physical exam  - CBC - Comprehensive metabolic panel - Lipid panel  2.  Family history of diabetes mellitus   3. Hyperglycemia  - Hemoglobin A1c  4. Family history of arrhythmia   5. First degree AV block Asymptomatic. Check periodic EKG.  - EKG 12-Lead  6. Nocturia          [name], MD  [facility] [phone] (phone) [phone] (fax)  [facility]

## 2023-03-09 ENCOUNTER — Encounter: Payer: 59 | Admitting: Urology

## 2023-04-05 ENCOUNTER — Other Ambulatory Visit: Payer: Self-pay

## 2023-04-05 DIAGNOSIS — R350 Frequency of micturition: Secondary | ICD-10-CM

## 2023-04-06 ENCOUNTER — Other Ambulatory Visit
Admission: RE | Admit: 2023-04-06 | Discharge: 2023-04-06 | Disposition: A | Discharge status: Home / Self Care | Payer: Commercial Managed Care - PPO | Attending: Urology | Admitting: Urology

## 2023-04-06 ENCOUNTER — Ambulatory Visit: Payer: Commercial Managed Care - PPO | Admitting: Urology

## 2023-04-06 VITALS — BP 167/92 | HR 74 | Ht 70.0 in | Wt 192.0 lb

## 2023-04-06 DIAGNOSIS — Z125 Encounter for screening for malignant neoplasm of prostate: Secondary | ICD-10-CM | POA: Diagnosis not present

## 2023-04-06 DIAGNOSIS — R350 Frequency of micturition: Secondary | ICD-10-CM | POA: Diagnosis not present

## 2023-04-06 DIAGNOSIS — R399 Unspecified symptoms and signs involving the genitourinary system: Secondary | ICD-10-CM | POA: Diagnosis not present

## 2023-04-06 LAB — URINALYSIS, COMPLETE (UACMP) WITH MICROSCOPIC
Bacteria, UA: NONE SEEN
Bilirubin Urine: NEGATIVE
Glucose, UA: NEGATIVE mg/dL
Hgb urine dipstick: NEGATIVE
Ketones, ur: NEGATIVE mg/dL
Leukocytes,Ua: NEGATIVE
Nitrite: NEGATIVE
Protein, ur: NEGATIVE mg/dL
RBC / HPF: NONE SEEN RBC/hpf (ref 0–5)
Specific Gravity, Urine: 1.01 (ref 1.005–1.030)
WBC, UA: NONE SEEN WBC/hpf (ref 0–5)
pH: 7 (ref 5.0–8.0)

## 2023-04-06 IMAGING — MR MR BRAIN/IAC WO/W CM
10 of 14 series · 28 of 48 positions shown · IV contrast (gadavist)
Comparison: None.

CLINICAL DATA: Left tinnitus

EXAM:
MRI HEAD WITHOUT AND WITH CONTRAST
TECHNIQUE: Multiplanar, multiecho pulse sequences of the brain and surrounding
structures were obtained without and with intravenous contrast.
CONTRAST:  9mL GADAVIST GADOBUTROL 1 MMOL/ML IV SOLN

[Series 5: T1 · sagittal · 5.0mm · 0.62mm/px · 3 of 23 slices shown (1 of 3)]
[im 1/23]
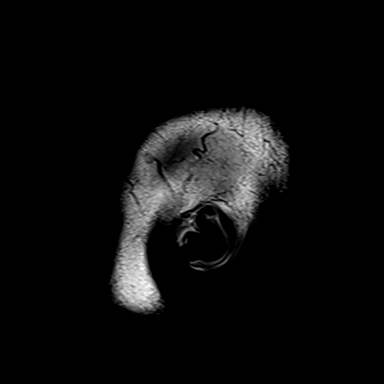
[im 12/23]
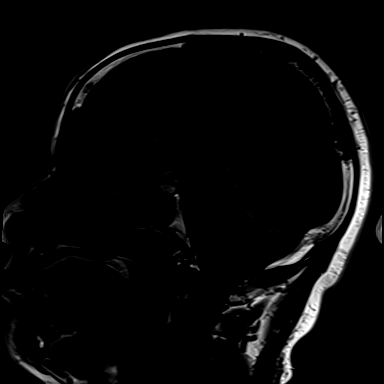
[im 23/23]
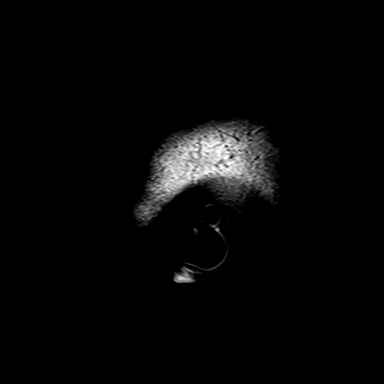

[Series 6: ax dwi_tracew · axial · 3.0mm · 0.65mm/px · z∈[-86,+66]mm · 3 of 48 slices shown]
[im 1/48]
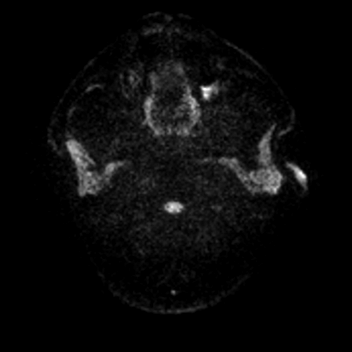
[im 24/48]
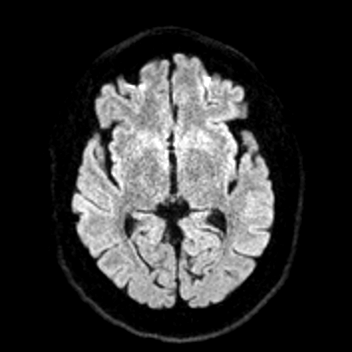
[im 48/48]
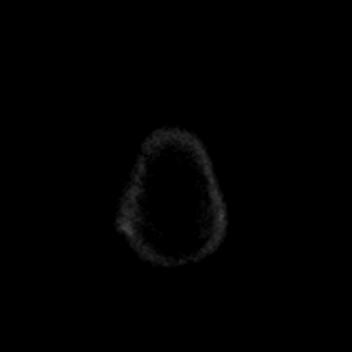

[Series 7: ax dwi_adc · axial · 3.0mm · 0.65mm/px · z∈[-86,+66]mm · 3 of 48 slices shown]
[im 1/48]
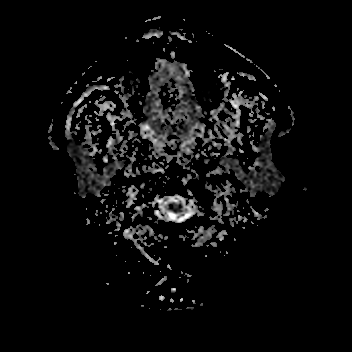
[im 24/48]
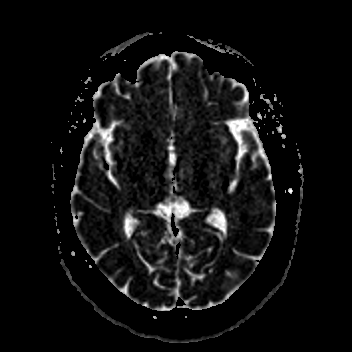
[im 48/48]
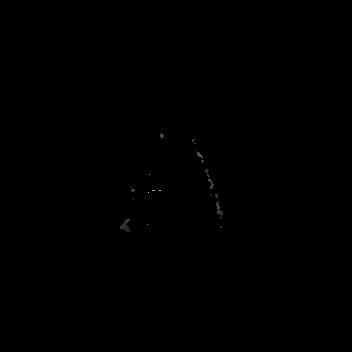

[Series 8: T2 · axial · 5.0mm · 0.53mm/px · z∈[-79,+62]mm · 2 of 25 slices shown]
[im 1/25]
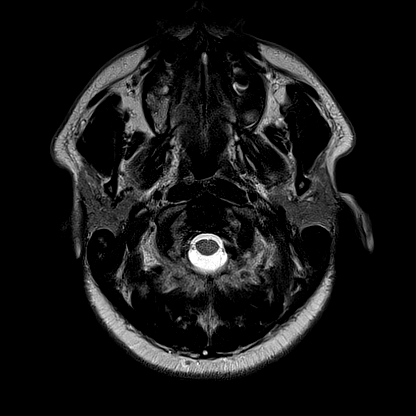
[im 25/25]
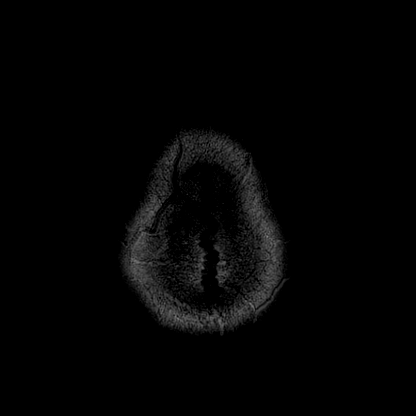

[Series 13: T1 · coronal · non-contrast · 3.0mm · 0.21mm/px · 1 of 13 slices shown (2 of 3)]
[im 1/13]
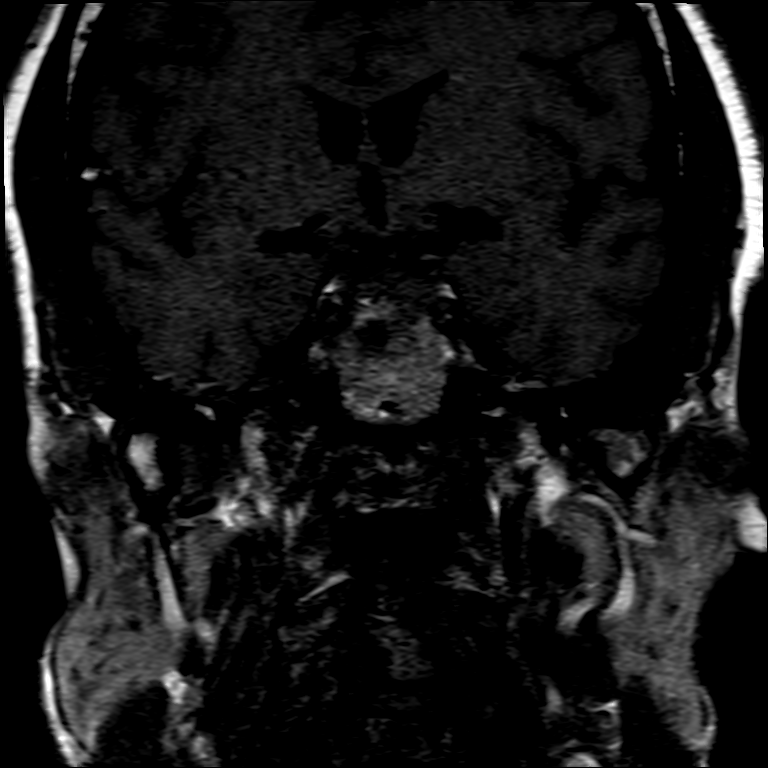

[Series 14: FLAIR · axial · 3.0mm · 0.53mm/px · z∈[-88,+71]mm · 4 of 55 slices shown]
[im 1/55]
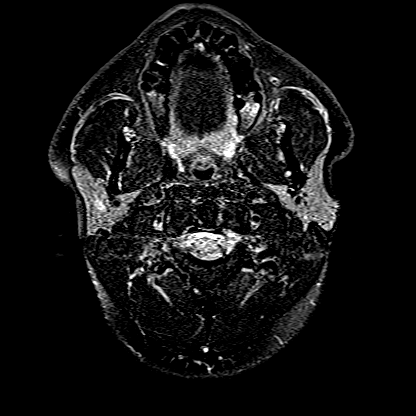
[im 19/55]
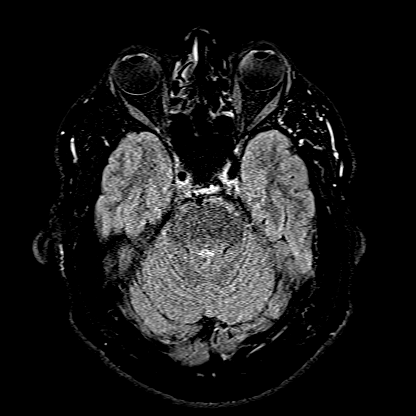
[im 37/55]
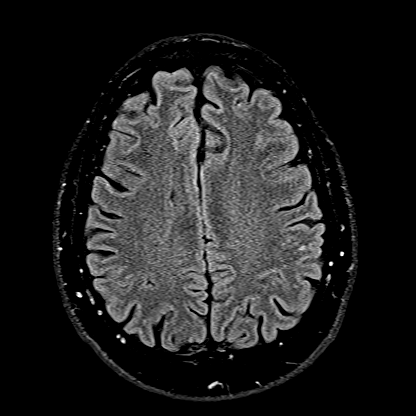
[im 55/55]
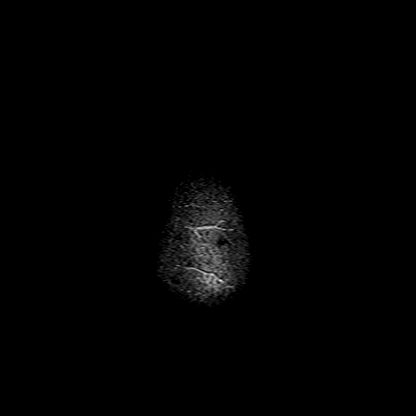

[Series 16: T1 · axial · non-contrast · 3.0mm · 0.21mm/px · 1 of 13 slices shown (3 of 3)]
[im 1/13]
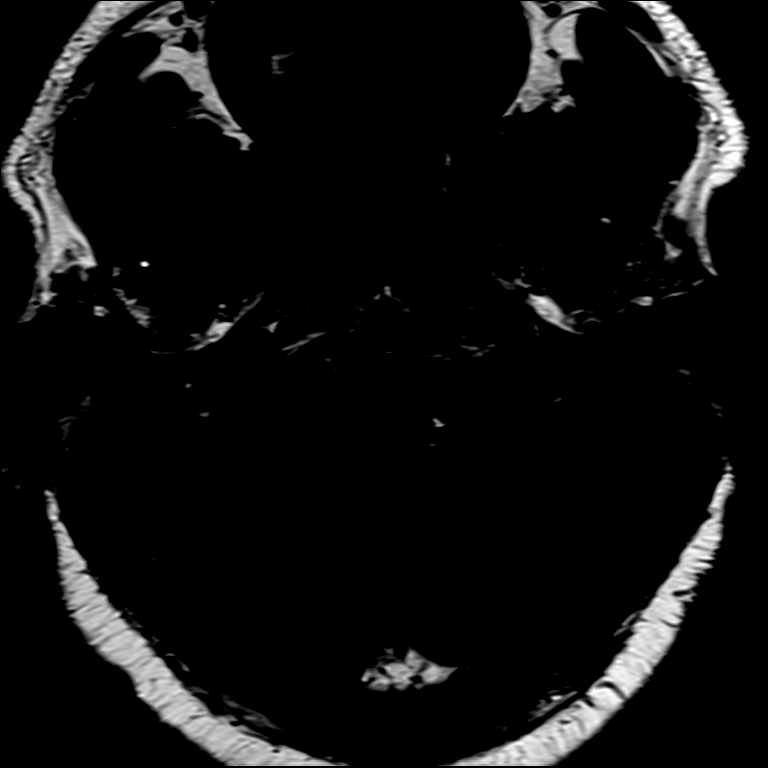

[Series 17: T1 post-contrast · axial · 3.0mm · 0.21mm/px · 1 of 13 slices shown (1 of 3)]
[im 1/13]
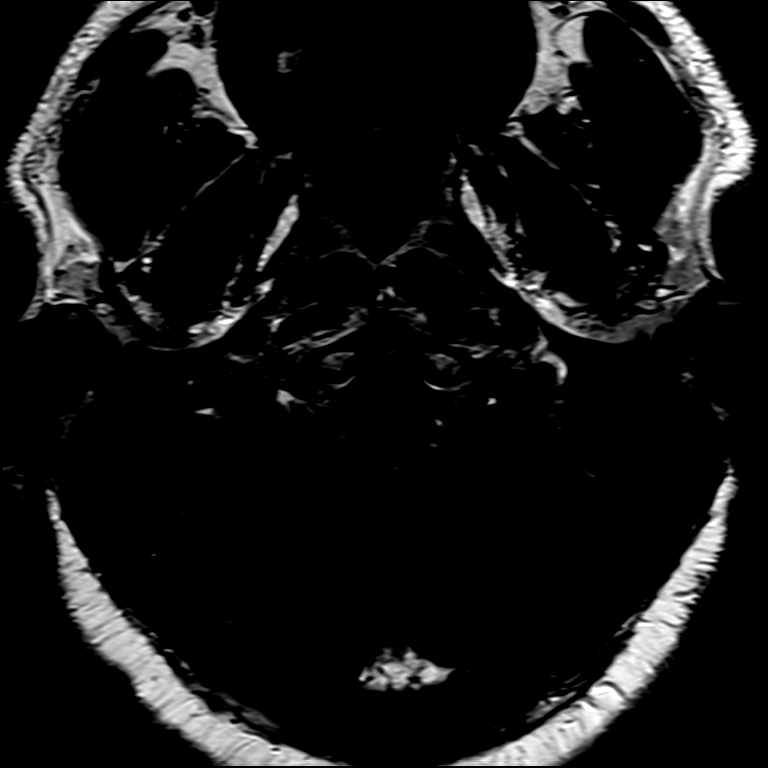

[Series 18: T1 post-contrast · coronal · 3.0mm · 0.21mm/px · 1 of 13 slices shown (2 of 3)]
[im 1/13]
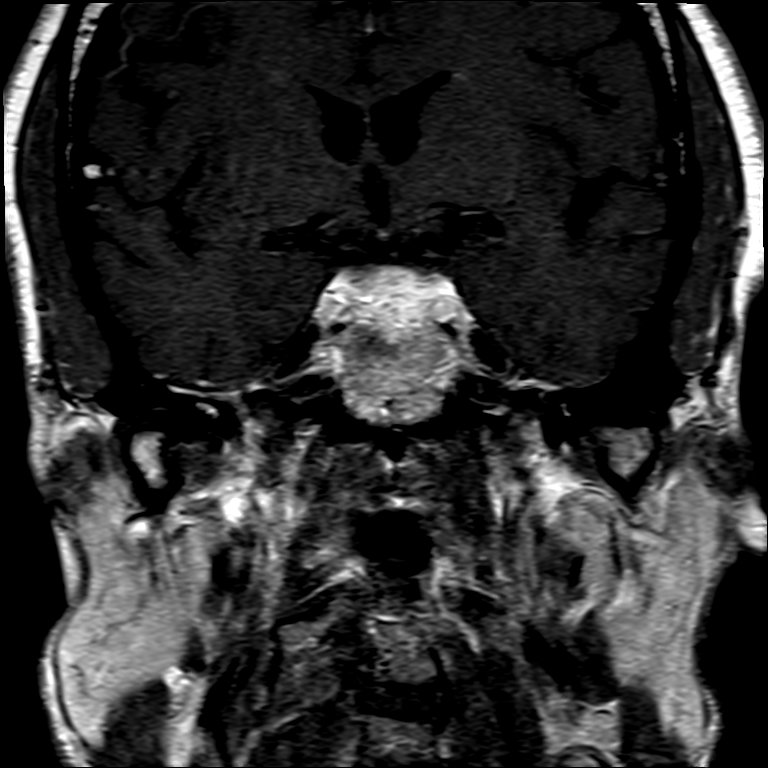

[Series 19: T1 post-contrast · axial · 1.0mm · 0.98mm/px · z∈[-97,+74]mm · 9 of 176 slices shown (3 of 3)]
[im 1/176]
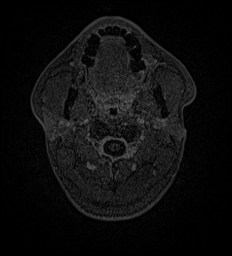
[im 30/176]
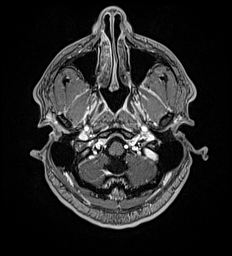
[im 59/176]
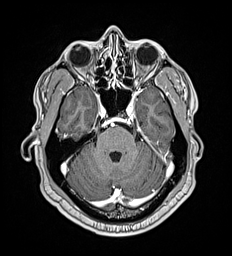
[im 73/176]
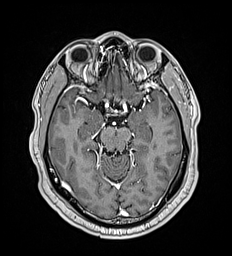
[im 88/176]
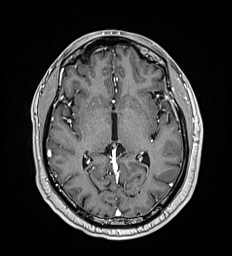
[im 103/176]
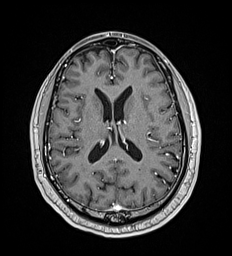
[im 117/176]
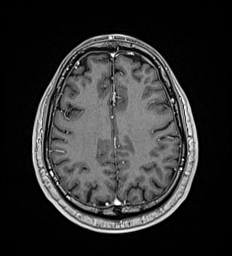
[im 146/176]
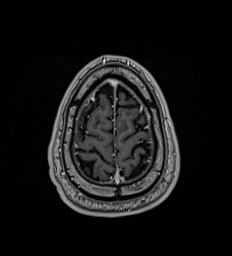
[im 176/176]
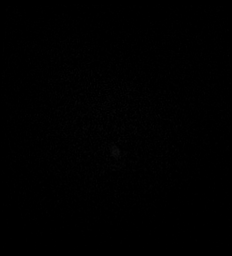

[28 of 48 positions shown; findings below may reference images not displayed]

FINDINGS: Brain: There is no cerebellopontine angle mass. Inner ear structures
demonstrate an unremarkable MR appearance. There is no abnormal
enhancement within the internal auditory canals.

No acute infarction or intracranial hemorrhage. There is no mass
effect or edema. Patchy foci of T2 hyperintensity in the
supratentorial white matter are nonspecific but may reflect minor
chronic microvascular ischemic changes. There is no extra-axial
fluid collection.

Vascular: Major vessel flow voids at the skull base are preserved.

Skull and upper cervical spine: Normal marrow signal is preserved.

Sinuses/Orbits: Paranasal sinuses are clear. The orbits are
unremarkable.

Other: The sella is unremarkable.  Mastoid air cells are clear.
IMPRESSION: No evidence of recent infarction, hemorrhage, mass, or abnormal
enhancement. Minor chronic microvascular ischemic changes.

## 2023-04-06 NOTE — Progress Notes (Signed)
 04/06/23 9:57 AM   [name] 26-Apr-1960 [id number]  CC: PSA monitoring, urinary symptoms  HPI: Healthy 63 year old male self-referred for mild urinary symptoms and PSA monitoring.  He was previously followed by Dr. Sullivan Endow, but none of those records are available to me, and he has not seen him in many years.  He reportedly had an elevated PSA in his 36s and underwent a prostate biopsy with Dr. Fredrick Jenkins that was normal, and PSA returned to normal on follow-up.  Most recent PSA from October 2023 is normal at 1.6 and stable from prior values.  Urinalysis today is completely benign, PVR normal at .  His only urinary complaints are some occasional urgency and frequency during the day, he typically does well overnight with nocturia 0-2x.  He denies any gross hematuria or urinary infections.  He drinks a fair amount of green tea during the day.  He previously has been offered Flomax , but did not feel his symptoms were bothersome enough to start that medication.   PMH: Past Medical History:  Diagnosis Date   Asthma    Atrial flutter [facility])     Surgical History: Past Surgical History:  Procedure Laterality Date   BACK SURGERY     COLONOSCOPY WITH PROPOFOL  N/A 02/08/2019   Procedure: COLONOSCOPY WITH PROPOFOL ;  Surgeon: Luke Salaam, MD;  Location: [facility] ENDOSCOPY;  Service: Gastroenterology;  Laterality: N/A;   ELECTROPHYSIOLOGIC STUDY     atrial flutter   hernia (other)  1980s   HERNIA REPAIR     lumbar back surgery  1999    Family History: Family History  Problem Relation Age of Onset   Diabetes Mother    Hyperlipidemia Mother    Glaucoma Mother    Emphysema Father    Glaucoma Father        open-angle   Asthma Father    Asthma Sister    Asthma Sister     Social History:  reports that he has never smoked. He has never used smokeless tobacco. He reports that he does not drink alcohol and does not use drugs.  Physical Exam: BP (!) 167/92 (BP Location: Left Arm, Patient  Position: Sitting, Cuff Size: Large)   Pulse 74   Ht 5\' 10"  (1.778 m)   Wt 192 lb (87.1 kg)   SpO2 95%   BMI 27.55 kg/m    Constitutional:  Alert and oriented, No acute distress. Cardiovascular: No clubbing, cyanosis, or edema. Respiratory: Normal respiratory effort, no increased work of breathing. GI: Abdomen is soft, nontender, nondistended, no abdominal masses   Laboratory Data: Reviewed, see HPI  Assessment & Plan:   63 year old male with normal PSA values, history of negative prostate biopsy with Dr. Sullivan Endow in his 65s, and mild urinary symptoms of urgency and frequency.  Urinalysis, PSA, PVR benign.  We discussed options including behavioral strategies and avoiding tea and bladder irritants, over-the-counter medications like saw palmetto or super beta prostate, or a trial of Flomax .  Risk and benefits were discussed.  He is not bothered enough to try prescription medications at this time, but will start by cutting back on tea and bladder irritants.  He would like to continue yearly urology follow-up for PVR and PSA monitoring.  Behavioral strategies discussed, okay to trial Flomax  in the future if worsening symptoms RTC 1 year PVR per patient preference, PSA will continue to be checked through PCP  Jay Meth, MD 04/06/2023  [facility] [address], [address] [place], [place] [address] [phone]

## 2023-04-06 NOTE — Patient Instructions (Signed)
 Nocturia refers to the need to wake up during the night to urinate, which can disrupt your sleep and impact your overall well-being. Fortunately, there are several strategies you can employ to help prevent or manage nocturia. It's important to consult with your healthcare provider before making any significant changes to your routine. Here are some helpful strategies to consider:  Limit Fluid Intake Before Bed: Avoid drinking large amounts of fluids in the evening, especially within a few hours of bedtime. Consume most of your daily fluid intake earlier in the day to reduce the need to urinate at night.  Monitor Your Diet: Limit your intake of caffeine and alcohol, as these substances can increase urine production and irritate the bladder.  Avoid tea, diet, "zero calorie," and artificially sweetened drinks, especially sodas, in the afternoon or evening. Be mindful of consuming foods and drinks with high water content before bedtime, such as watermelon and herbal teas.  Time Your Medications: If you're taking medications that contribute to increased urination, consult your healthcare provider about adjusting the timing of these medications to minimize their impact during the night.  Practice Double Voiding: Before going to bed, make an effort to empty your bladder twice within a short period. This can help reduce the amount of urine left in your bladder before sleep.  Bladder Training: Gradually increase the time between bathroom visits during the day to train your bladder to hold larger volumes of urine. Over time, this can help reduce the frequency of nighttime awakenings to urinate.  Elevate Your Legs During the Day: Elevating your legs during the day can help minimize fluid retention in your lower extremities, which might reduce nighttime urination.  Pelvic Floor Exercises: Strengthening your pelvic floor muscles through Kegel exercises can help improve bladder control and potentially  reduce the urge to urinate at night.  Create a Relaxing Bedtime Routine: Stress and anxiety can exacerbate nocturia. Engage in calming activities before bed, such as reading, listening to soothing music, or practicing relaxation techniques.  Stay Active: Engage in regular physical activity, but avoid intense exercise close to bedtime, as this can increase your body's demand for fluids.  Maintain a Healthy Weight: Excess weight can compress the bladder and contribute to bladder and urinary issues. Aim to achieve and maintain a healthy weight through a balanced diet and regular exercise.  Remember that every individual is unique, and the effectiveness of these strategies may vary. It's important to work with your healthcare provider to develop a plan that suits your specific needs and addresses any underlying causes of nocturia.   Prostate Cancer Screening  Prostate cancer screening is testing that is done to check for the presence of prostate cancer in men. The prostate gland is a walnut-sized gland that is located below the bladder and in front of the rectum in males. The function of the prostate is to add fluid to semen during ejaculation. Prostate cancer is one of the most common types of cancer in men. Who should have prostate cancer screening? Screening recommendations vary based on age and other risk factors, as well as between the professional organizations who make the recommendations. In general, screening is recommended if: You are age 48 to 69 and have an average risk for prostate cancer. You should talk with your health care provider about your need for screening and how often screening should be done. Because most prostate cancers are slow growing and will not cause death, screening in this age group is generally reserved for men who  have a 10- to 15-year life expectancy. You are younger than age 69, and you have these risk factors: Having a father, brother, or uncle who has been  diagnosed with prostate cancer. The risk is higher if your family member's cancer occurred at an early age or if you have multiple family members with prostate cancer at an early age. Being a male who is [place] or is of [place] or sub-Saharan African descent. In general, screening is not recommended if: You are younger than age 1. You are between the ages of 40 and 96 and you have no risk factors. You are 31 years of age or older. At this age, the risks that screening can cause are greater than the benefits that it may provide. If you are at high risk for prostate cancer, your health care provider may recommend that you have screenings more often or that you start screening at a younger age. How is screening for prostate cancer done? The recommended prostate cancer screening test is a blood test called the prostate-specific antigen (PSA) test. PSA is a protein that is made in the prostate. As you age, your prostate naturally produces more PSA. Abnormally high PSA levels may be caused by: Prostate cancer. An enlarged prostate that is not caused by cancer (benign prostatic hyperplasia, or BPH). This condition is very common in older men. A prostate gland infection (prostatitis) or urinary tract infection. Certain medicines such as male hormones (like testosterone) or other medicines that raise testosterone levels. A rectal exam may be done as part of prostate cancer screening to help provide information about the size of your prostate gland. When a rectal exam is performed, it should be done after the PSA level is drawn to avoid any effect on the results. Depending on the PSA results, you may need more tests, such as: A physical exam to check the size of your prostate gland, if not done as part of screening. Blood and imaging tests. A procedure to remove tissue samples from your prostate gland for testing (biopsy). This is the only way to know for certain if you have prostate cancer. What are the  benefits of prostate cancer screening? Screening can help to identify cancer at an early stage, before symptoms start and when the cancer can be treated more easily. There is a small chance that screening may lower your risk of dying from prostate cancer. The chance is small because prostate cancer is a slow-growing cancer, and most men with prostate cancer die from a different cause. What are the risks of prostate cancer screening? The main risk of prostate cancer screening is diagnosing and treating prostate cancer that would never have caused any symptoms or problems. This is called overdiagnosisand overtreatment. PSA screening cannot tell you if your PSA is high due to cancer or a different cause. A prostate biopsy is the only procedure to diagnose prostate cancer. Even the results of a biopsy may not tell you if your cancer needs to be treated. Slow-growing prostate cancer may not need any treatment other than monitoring, so diagnosing and treating it may cause unnecessary stress or other side effects. Questions to ask your health care provider When should I start prostate cancer screening? What is my risk for prostate cancer? How often do I need screening? What type of screening tests do I need? How do I get my test results? What do my results mean? Do I need treatment? Where to find more information The American Cancer Society: www.cancer.org American Urological  Association: www.auanet.org Contact a health care provider if: You have difficulty urinating. You have pain when you urinate or ejaculate. You have blood in your urine or semen. You have pain in your back or in the area of your prostate. Summary Prostate cancer is a common type of cancer in men. The prostate gland is located below the bladder and in front of the rectum. This gland adds fluid to semen during ejaculation. Prostate cancer screening may identify cancer at an early stage, when the cancer can be treated more easily  and is less likely to have spread to other areas of the body. The prostate-specific antigen (PSA) test is the recommended screening test for prostate cancer, but it has associated risks. Discuss the risks and benefits of prostate cancer screening with your health care provider. If you are age 88 or older, the risks that screening can cause are greater than the benefits that it may provide. This information is not intended to replace advice given to you by your health care provider. Make sure you discuss any questions you have with your health care provider. Document Revised: 09/01/2020 Document Reviewed: 09/01/2020 Elsevier Patient Education  2024 [employer].

## 2023-06-08 DIAGNOSIS — H47393 Other disorders of optic disc, bilateral: Secondary | ICD-10-CM | POA: Diagnosis not present

## 2023-06-08 DIAGNOSIS — H2513 Age-related nuclear cataract, bilateral: Secondary | ICD-10-CM | POA: Diagnosis not present

## 2023-06-08 DIAGNOSIS — H47239 Glaucomatous optic atrophy, unspecified eye: Secondary | ICD-10-CM | POA: Diagnosis not present

## 2023-08-01 ENCOUNTER — Telehealth: Payer: Self-pay | Admitting: Urology

## 2023-08-01 DIAGNOSIS — R5383 Other fatigue: Secondary | ICD-10-CM

## 2023-08-01 NOTE — Telephone Encounter (Signed)
 Testosterone lab ordered.

## 2023-08-01 NOTE — Telephone Encounter (Signed)
 Patient sent mychart appointment request to see Dr. Estanislao Heimlich and have testosterone labs done. He wants to discuss supplement and his testosterone blood levels. I scheduled his appointment for 08/16/23 in [place]. Can lab order to put in? Please advise.

## 2023-08-16 ENCOUNTER — Ambulatory Visit: Admitting: Urology

## 2023-08-16 ENCOUNTER — Other Ambulatory Visit
Admission: RE | Admit: 2023-08-16 | Discharge: 2023-08-16 | Disposition: A | Discharge status: Home / Self Care | Attending: Urology | Admitting: Urology

## 2023-08-16 ENCOUNTER — Encounter: Payer: Self-pay | Admitting: Urology

## 2023-08-16 VITALS — BP 129/69 | HR 78 | Ht 70.0 in | Wt 192.0 lb

## 2023-08-16 DIAGNOSIS — R5383 Other fatigue: Secondary | ICD-10-CM | POA: Diagnosis not present

## 2023-08-16 DIAGNOSIS — N529 Male erectile dysfunction, unspecified: Secondary | ICD-10-CM

## 2023-08-16 DIAGNOSIS — R399 Unspecified symptoms and signs involving the genitourinary system: Secondary | ICD-10-CM

## 2023-08-16 MED ORDER — TADALAFIL 5 MG PO TABS: 5.0000 mg | ORAL_TABLET | Freq: Every day | ORAL | 11 refills | Status: DC | PRN

## 2023-08-16 NOTE — Progress Notes (Signed)
   08/16/2023 10:09 AM   [name] 1960/09/09 [id number]  Reason for visit: Discuss testosterone, PSA screening, LUTS  HPI: 63 year old male who I originally met in January 2025 after he transferred his care following Dr. Gaylyn Keas retirement.  He reportedly had a history of a significantly elevated PSA greater than 50 underwent a prostate biopsy that was benign, follow-up PSA decreased to normal, most recently 1.6 in October 2023 which had been stable over the last few years.  He also has mild urinary symptoms of urgency and frequency, drinks a fair amount of tea, not bothered enough to consider prescription medications.  After decreasing his tea intake and starting over-the-counter saw palmetto, urinary symptoms have completely resolved.  Denies any urinary complaints today.  He made an appointment today to discuss testosterone levels.  There are no prior testosterone levels to review.  He also is having some problems with low libido and some mild ED.  Has never tried medications for this.  He works out extensively.  We reviewed the AUA guidelines regarding testosterone evaluation and management, and I recommended starting with a testosterone level this morning, and a trial of Cialis 5 mg on demand for ED.  Urinary symptoms-resolved with decreasing bladder irritants and adding saw palmetto OTC Will check testosterone level today, call with results Trial of Cialis 5 mg on demand for ED  Lawerence Pressman, MD  [facility] [address], [address] [place], [place] [address] [phone]

## 2023-08-17 LAB — TESTOSTERONE: Testosterone: 411 ng/dL (ref 264–916)

## 2023-08-18 ENCOUNTER — Ambulatory Visit: Payer: Self-pay | Admitting: Urology

## 2023-10-21 ENCOUNTER — Ambulatory Visit: Admitting: Family Medicine

## 2023-10-21 ENCOUNTER — Encounter: Payer: Self-pay | Admitting: Family Medicine

## 2023-10-21 VITALS — BP 121/80 | HR 60 | Temp 98.1°F | Ht 70.0 in | Wt 198.2 lb

## 2023-10-21 DIAGNOSIS — Z0000 Encounter for general adult medical examination without abnormal findings: Secondary | ICD-10-CM | POA: Diagnosis not present

## 2023-10-21 DIAGNOSIS — Z23 Encounter for immunization: Secondary | ICD-10-CM

## 2023-10-21 DIAGNOSIS — Z125 Encounter for screening for malignant neoplasm of prostate: Secondary | ICD-10-CM

## 2023-10-21 NOTE — Progress Notes (Signed)
 Complete physical exam   Patient: [name]   DOB: [birth year]   63 y.o. Male  MRN: [id number] Visit Date: 10/21/2023  Today's healthcare provider: Nancyann Perry, MD   Chief Complaint  Patient presents with   Annual Exam    Diet: Normal Exercise: 5x week, 1.5 h Sleep: Well Overall Feeling: Well Pneumonia vaccine:     Subjective    [name] is a 63 y.o. male who presents today for a complete physical exam.  He reports consuming a healthy diet. He exercises either cardio or weight bearing about 5 days a week. He generally feels fairly well. He reports sleeping well. He does not have additional problems to discuss today.   He reports he occasionally gets a little tight in his chest he mows without wearing a mask. Has taken Primatene tablets in the past.   Past Medical History:  Diagnosis Date   Asthma    Atrial flutter [facility])    Past Surgical History:  Procedure Laterality Date   BACK SURGERY     COLONOSCOPY WITH PROPOFOL  N/A 02/08/2019   Procedure: COLONOSCOPY WITH PROPOFOL ;  Surgeon: Therisa Bi, MD;  [facility] END[facility]COPY;  Service: Gastroenterology;  Laterality: N/A;   ELECTROPHYSIOLOGIC STUDY     atrial flutter   hernia (other)  1980s   HERNIA REPAIR     lumbar back surgery  1999   Social History   Socioeconomic History   Marital status: Married    Spouse name: Not on file   Number of children: Not on file   Years of education: Not on file   Highest education level: Not on file  Occupational History   Not on file  Tobacco Use   Smoking status: Never   Smokeless tobacco: Never  Vaping Use   Vaping status: Never Used  Substance and Sexual Activity   Alcohol use: Never   Drug use: Never   Sexual activity: Yes    Birth control/protection: None  Other Topics Concern   Not on file  Social History Narrative   Married. 4 children. Works as a Engineer, civil (consulting).    Social Drivers of Corporate investment banker Strain: Low Risk  (10/21/2023)    Overall Financial Resource Strain (CARDIA)    Difficulty of Paying Living Expenses: Not hard at all  Food Insecurity: No Food Insecurity (10/21/2023)   Hunger Vital Sign    Worried About Running Out of Food in the Last Year: Never true    Ran Out of Food in the Last Year: Never true  Transportation Needs: No Transportation Needs (10/21/2023)   PRAPARE - Administrator, Civil Service (Medical): No    Lack of Transportation (Non-Medical): No  Physical Activity: Sufficiently Active (10/21/2023)   Exercise Vital Sign    Days of Exercise per Week: 5 days    Minutes of Exercise per Session: 90 min  Stress: No Stress Concern Present (10/21/2023)   [employer] of Occupational Health - Occupational Stress Questionnaire    Feeling of Stress: Not at all  Social Connections: Moderately Integrated (10/21/2023)   Social Connection and Isolation Panel    Frequency of Communication with Friends and Family: More than three times a week    Frequency of Social Gatherings with Friends and Family: Twice a week    Attends Religious Services: More than 4 times per year    Active Member of [employer] or Organizations: No    Attends Banker Meetings: Never  Marital Status: Married  Catering manager Violence: Not At Risk (10/21/2023)   Humiliation, Afraid, Rape, and Kick questionnaire    Fear of Current or Ex-Partner: No    Emotionally Abused: No    Physically Abused: No    Sexually Abused: No   Family Status  Relation Name Status   Mother L Alive   Father R Deceased   Sister  Deceased   Brother  Alive   Sister  Alive   Sister  Alive   Sister D Alive  No partnership data on file   Family History  Problem Relation Age of Onset   Diabetes Mother    Hyperlipidemia Mother    Glaucoma Mother    Emphysema Father    Glaucoma Father        open-angle   Asthma Father    Asthma Sister    Asthma Sister    No Known Allergies  Patient Care Team: Gasper Nancyann BRAVO, MD as PCP - General  (Family Medicine)   Medications: Outpatient Medications Prior to Visit  Medication Sig   aspirin  81 MG chewable tablet Chew 81 mg by mouth daily.   Black Pepper-Turmeric (TURMERIC CURCUMIN) 07-998 MG CAPS Take by mouth.   Cholecalciferol (VITAMIN D) 2000 units CAPS Take 4,000 Units by mouth daily.   KRILL OIL PO Take 1,250 mg by mouth daily. ANTARCTIC KRILL OIL   magnesium (MAGTAB) 84 MG ( ) TBCR SR tablet Take 84 mg by mouth daily.   Multiple Vitamins-Minerals (DAILY MULTI) TABS Take by mouth daily.   tadalafil  (CIALIS ) 5 MG tablet Take 1 tablet (5 mg total) by mouth daily as needed for erectile dysfunction (take 1 hour prior to sexual activity). (Patient not taking: Reported on 10/21/2023)   No facility-administered medications prior to visit.    Review of Systems  Constitutional:  Negative for appetite change, chills and fever.  Respiratory:  Negative for chest tightness, shortness of breath and wheezing.   Cardiovascular:  Negative for chest pain and palpitations.  Gastrointestinal:  Negative for abdominal pain, nausea and vomiting.      Objective    BP 121/80 (BP Location: Left Arm, Patient Position: Sitting)   Pulse 60   Temp 98.1 F (36.7 C) (Oral)   Ht 5' 10 (1.778 m)   Wt 198 lb 3.2 oz (89.9 kg)   SpO2 98%   BMI 28.44 kg/m    Physical Exam    General Appearance:    Well developed, well nourished male. Alert, cooperative, in no acute distress, appears stated age  Head:    Normocephalic, without obvious abnormality, atraumatic  Eyes:    PERRL, conjunctiva/corneas clear, EOM's intact, fundi    benign, both eyes       Ears:    Normal TM's and external ear canals, both ears  Nose:   Nares normal, septum midline, mucosa normal, no drainage   or sinus tenderness  Throat:   Lips, mucosa, and tongue normal; teeth and gums normal  Neck:   Supple, symmetrical, trachea midline, no adenopathy;       thyroid:  No enlargement/tenderness/nodules; no carotid   bruit or JVD   Back:     Symmetric, no curvature, ROM normal, no CVA tenderness  Lungs:     Clear to auscultation bilaterally, respirations unlabored  Chest wall:    No tenderness or deformity  Heart:    Normal heart rate. Normal rhythm. No murmurs, rubs, or gallops.  S1 and S2 normal  Abdomen:  Soft, non-tender, bowel sounds active all four quadrants,    no masses, no organomegaly  Genitalia:    deferred  Rectal:    deferred  Extremities:   All extremities are intact. No cyanosis or edema  Pulses:   2+ and symmetric all extremities  Skin:   Skin color, texture, turgor normal, no rashes or lesions  Lymph nodes:   Cervical, supraclavicular, and axillary nodes normal  Neurologic:   CNII-XII intact. Normal strength, sensation and reflexes      throughout     Last depression screening scores    10/21/2023    9:15 AM 10/20/2022    9:51 AM 01/08/2022   10:16 AM  PHQ 2/9 Scores  PHQ - 2 Score 0 0 0  PHQ- 9 Score 0  0   Last fall risk screening    10/21/2023    9:15 AM  Fall Risk   Falls in the past year? 0  Number falls in past yr: 0  Injury with Fall? 0  Risk for fall due to : No Fall Risks  Follow up Falls evaluation completed   Last Audit-C alcohol use screening    10/21/2023    9:10 AM  Alcohol Use Disorder Test (AUDIT)  1. How often do you have a drink containing alcohol? 0  2. How many drinks containing alcohol do you have on a typical day when you are drinking? 0  3. How often do you have six or more drinks on one occasion? 0  AUDIT-C Score 0   A score of 3 or more in women, and 4 or more in men indicates increased risk for alcohol abuse, EXCEPT if all of the points are from question 1     Assessment & Plan    Routine Health Maintenance and Physical Exam  Exercise Activities and Dietary recommendations  Goals   None     Immunization History  Administered Date(s) Administered   Hepatitis B 03/22/2005   Influenza,inj,Quad PF,6+ Mos 11/14/2018, 12/19/2020, 12/29/2021    Influenza-Unspecified 01/03/2023   PFIZER(Purple Top)SARS-COV-2 Vaccination 03/20/2019, 04/09/2019   PNEUMOCOCCAL CONJUGATE-20 10/21/2023   Pfizer Covid-19 Vaccine Bivalent Booster 47yrs & up 02/21/2020   Pfizer(Comirnaty)Fall Seasonal Vaccine 12 years and older 04/12/2022   Td 05/28/1996, 10/16/2009, 09/02/2017   Tdap 09/24/2005, 08/05/2018   Zoster Recombinant(Shingrix ) 01/08/2022, 04/12/2022    Health Maintenance  Topic Date Due   Hepatitis B Vaccines (2 of 3 - 19+ 3-dose series) 04/19/2005   INFLUENZA VACCINE  10/21/2023   COVID-19 Vaccine (5 - 2024-25 season) 11/06/2023 (Originally 11/21/2022)   DTaP/Tdap/Td (6 - Td or Tdap) 08/04/2028   Colonoscopy  02/07/2029   Pneumococcal Vaccine: 50+ Years  Completed   Hepatitis C Screening  Completed   HIV Screening  Completed   Zoster Vaccines- Shingrix   Completed   HPV VACCINES  Aged Out   Meningococcal B Vaccine  Aged Out    Discussed health benefits of physical activity, and encouraged him to engage in regular exercise appropriate for his age and condition.   Asthma history  Rarely has mild tightness in chest when mowing with a mask. Has taken Primatene tablets in the past and counseled that potential negative health effects and recommended albuterol  inhaler use instead. He will consider in the future.   - CBC w/Diff/Platelet - Lipid Panel With LDL/HDL Ratio - Comprehensive Metabolic Panel (CMET) - Pneumococcal conjugate vaccine 20-valent (Prevnar 20)  2. Prostate cancer screening  - PSA Total (Reflex To Free)  3. Need for prophylactic  vaccination against Streptococcus pneumoniae (pneumococcus)  - Pneumococcal conjugate vaccine 20-valent (Prevnar 20)         Nancyann Perry, MD  [facility] [phone] (phone) [phone] (fax)  [facility]

## 2023-10-22 LAB — COMPREHENSIVE METABOLIC PANEL WITH GFR
ALT: 38 IU/L (ref 0–44)
AST: 55 IU/L — ABNORMAL HIGH (ref 0–40)
Albumin: 4.2 g/dL (ref 3.9–4.9)
Alkaline Phosphatase: 67 IU/L (ref 44–121)
BUN/Creatinine Ratio: 15 (ref 10–24)
BUN: 19 mg/dL (ref 8–27)
Bilirubin Total: 0.5 mg/dL (ref 0.0–1.2)
CO2: 22 mmol/L (ref 20–29)
Calcium: 9.2 mg/dL (ref 8.6–10.2)
Chloride: 102 mmol/L (ref 96–106)
Creatinine, Ser: 1.23 mg/dL (ref 0.76–1.27)
Globulin, Total: 2.6 g/dL (ref 1.5–4.5)
Glucose: 89 mg/dL (ref 70–99)
Potassium: 4.1 mmol/L (ref 3.5–5.2)
Sodium: 138 mmol/L (ref 134–144)
Total Protein: 6.8 g/dL (ref 6.0–8.5)
eGFR: 65 mL/min/1.73 (ref >59)

## 2023-10-22 LAB — CBC WITH DIFFERENTIAL/PLATELET
Basophils Absolute: 0 x10E3/uL (ref 0.0–0.2)
Basos: 1 %
EOS (ABSOLUTE): 0.3 x10E3/uL (ref 0.0–0.4)
Eos: 6 %
Hematocrit: 42.7 % (ref 37.5–51.0)
Hemoglobin: 13.9 g/dL (ref 13.0–17.7)
Immature Grans (Abs): 0 x10E3/uL (ref 0.0–0.1)
Immature Granulocytes: 0 %
Lymphocytes Absolute: 1.8 x10E3/uL (ref 0.7–3.1)
Lymphs: 41 %
MCH: 32.1 pg (ref 26.6–33.0)
MCHC: 32.6 g/dL (ref 31.5–35.7)
MCV: 99 fL — ABNORMAL HIGH (ref 79–97)
Monocytes Absolute: 0.5 x10E3/uL (ref 0.1–0.9)
Monocytes: 13 %
Neutrophils Absolute: 1.7 x10E3/uL (ref 1.4–7.0)
Neutrophils: 39 %
Platelets: 218 x10E3/uL (ref 150–450)
RBC: 4.33 x10E6/uL (ref 4.14–5.80)
RDW: 12.1 % (ref 11.6–15.4)
WBC: 4.3 x10E3/uL (ref 3.4–10.8)

## 2023-10-22 LAB — LIPID PANEL WITH LDL/HDL RATIO
Cholesterol, Total: 170 mg/dL (ref 100–199)
HDL: 61 mg/dL (ref >39)
LDL Chol Calc (NIH): 99 mg/dL (ref 0–99)
LDL/HDL Ratio: 1.6 ratio (ref 0.0–3.6)
Triglycerides: 49 mg/dL (ref 0–149)
VLDL Cholesterol Cal: 10 mg/dL (ref 5–40)

## 2023-10-22 LAB — PSA TOTAL (REFLEX TO FREE): Prostate Specific Ag, Serum: 1.6 ng/mL (ref 0.0–4.0)

## 2023-10-23 ENCOUNTER — Ambulatory Visit: Payer: Self-pay | Admitting: Family Medicine

## 2024-01-17 ENCOUNTER — Ambulatory Visit (INDEPENDENT_AMBULATORY_CARE_PROVIDER_SITE_OTHER)

## 2024-01-17 DIAGNOSIS — Z23 Encounter for immunization: Secondary | ICD-10-CM | POA: Diagnosis not present

## 2024-04-10 ENCOUNTER — Ambulatory Visit: Payer: Self-pay | Admitting: Urology

## 2024-04-20 ENCOUNTER — Ambulatory Visit: Admitting: Urology

## 2024-04-20 VITALS — BP 149/85 | HR 64 | Ht 70.0 in | Wt 195.0 lb

## 2024-04-20 DIAGNOSIS — Z125 Encounter for screening for malignant neoplasm of prostate: Secondary | ICD-10-CM

## 2024-04-20 DIAGNOSIS — R399 Unspecified symptoms and signs involving the genitourinary system: Secondary | ICD-10-CM | POA: Diagnosis not present

## 2024-04-20 LAB — BLADDER SCAN AMB NON-IMAGING

## 2024-04-20 NOTE — Progress Notes (Signed)
" ° °  04/20/2024 9:29 AM   [name] 1961-02-09 [id number]  Reason for visit: Follow up PSA screening, urinary symptoms/BPH, ED  History: Previously followed by Dr. Gala, initial visit with [facility] urology January 2025 History of elevated PSA greater than 50 in his 96s and underwent negative prostate biopsy with Dr. Gala, PSA has decreased to normal and been stable since then(~1.5) Urinary symptoms resolved by avoiding bladder irritants and adding saw palmetto over-the-counter Trialed on Cialis  for mild ED May 2025  Physical Exam: BP (!) 149/85 (BP Location: Left Arm, Patient Position: Sitting, Cuff Size: Large)   Pulse 64   Ht 5' 10 (1.778 m)   Wt 195 lb (88.5 kg)   SpO2 98%   BMI 27.98 kg/m   Imaging/labs: PSA August 2025 1.6 and stable over the last 10 years Creatinine 1.2, eGFR greater than 60 Testosterone  May 2025 normal at 411  Today: Continues to do well, denies any urinary complaints when avoiding tea and taking the saw palmetto, PVR today normal at 90ml Tried Cialis  a few times but had a headache, ED has resolved by adjusting his sleep schedule  Plan:   PSA screening: PSA has been stable, reassurance provided, with his excellent health would be reasonable to continue screening through age 27 BPH/LUTS: Resolved by avoiding tea and starting saw palmetto, discussed he could stop the saw palmetto and see if symptoms recur to potentially reduce one of his supplements.  PVRs have was been normal ED: Headaches from Cialis , ED resolved with increased sleep Follow-up with urology as needed, return precautions were discussed   Brad JAYSON Burnet, MD  [facility] [address], [address] [place], [place] [address] [phone]  "

## 2024-04-20 NOTE — Patient Instructions (Signed)
